# Patient Record
Sex: Female | Born: 1945 | Race: Asian | Hispanic: No | Marital: Married | Smoking: Never smoker
Health system: Southern US, Community
[De-identification: ages and names within clinical notes are randomized; demographics above are authoritative.]

## PROBLEM LIST (undated history)

## (undated) DIAGNOSIS — K2970 Gastritis, unspecified, without bleeding: Secondary | ICD-10-CM

## (undated) DIAGNOSIS — Z9989 Dependence on other enabling machines and devices: Principal | ICD-10-CM

## (undated) DIAGNOSIS — E785 Hyperlipidemia, unspecified: Secondary | ICD-10-CM

## (undated) DIAGNOSIS — G4733 Obstructive sleep apnea (adult) (pediatric): Secondary | ICD-10-CM

## (undated) DIAGNOSIS — E079 Disorder of thyroid, unspecified: Secondary | ICD-10-CM

## (undated) DIAGNOSIS — Z8669 Personal history of other diseases of the nervous system and sense organs: Secondary | ICD-10-CM

## (undated) DIAGNOSIS — R319 Hematuria, unspecified: Secondary | ICD-10-CM

## (undated) DIAGNOSIS — E041 Nontoxic single thyroid nodule: Secondary | ICD-10-CM

## (undated) DIAGNOSIS — H9190 Unspecified hearing loss, unspecified ear: Secondary | ICD-10-CM

## (undated) HISTORY — DX: Disorder of thyroid, unspecified: E07.9

## (undated) HISTORY — PX: ESOPHAGOGASTRODUODENOSCOPY: SHX1529

## (undated) HISTORY — DX: Nontoxic single thyroid nodule: E04.1

## (undated) HISTORY — DX: Dependence on other enabling machines and devices: Z99.89

## (undated) HISTORY — DX: Obstructive sleep apnea (adult) (pediatric): G47.33

## (undated) HISTORY — DX: Unspecified hearing loss, unspecified ear: H91.90

## (undated) HISTORY — DX: Hematuria, unspecified: R31.9

## (undated) HISTORY — DX: Gastritis, unspecified, without bleeding: K29.70

## (undated) HISTORY — DX: Hyperlipidemia, unspecified: E78.5

## (undated) HISTORY — DX: Personal history of other diseases of the nervous system and sense organs: Z86.69

---

## 2014-10-26 DIAGNOSIS — K219 Gastro-esophageal reflux disease without esophagitis: Secondary | ICD-10-CM | POA: Diagnosis not present

## 2014-10-26 DIAGNOSIS — E785 Hyperlipidemia, unspecified: Secondary | ICD-10-CM | POA: Diagnosis not present

## 2014-10-26 DIAGNOSIS — L219 Seborrheic dermatitis, unspecified: Secondary | ICD-10-CM | POA: Diagnosis not present

## 2014-10-26 DIAGNOSIS — R002 Palpitations: Secondary | ICD-10-CM | POA: Diagnosis not present

## 2014-10-28 DIAGNOSIS — E042 Nontoxic multinodular goiter: Secondary | ICD-10-CM | POA: Diagnosis not present

## 2014-10-28 DIAGNOSIS — R312 Other microscopic hematuria: Secondary | ICD-10-CM | POA: Diagnosis not present

## 2014-10-28 DIAGNOSIS — R3912 Poor urinary stream: Secondary | ICD-10-CM | POA: Diagnosis not present

## 2014-10-28 DIAGNOSIS — H905 Unspecified sensorineural hearing loss: Secondary | ICD-10-CM | POA: Diagnosis not present

## 2014-10-28 DIAGNOSIS — N952 Postmenopausal atrophic vaginitis: Secondary | ICD-10-CM | POA: Diagnosis not present

## 2014-11-05 DIAGNOSIS — G4733 Obstructive sleep apnea (adult) (pediatric): Secondary | ICD-10-CM | POA: Diagnosis not present

## 2015-01-04 DIAGNOSIS — Z1231 Encounter for screening mammogram for malignant neoplasm of breast: Secondary | ICD-10-CM | POA: Diagnosis not present

## 2015-01-18 DIAGNOSIS — E041 Nontoxic single thyroid nodule: Secondary | ICD-10-CM | POA: Diagnosis not present

## 2015-01-20 DIAGNOSIS — R221 Localized swelling, mass and lump, neck: Secondary | ICD-10-CM | POA: Diagnosis not present

## 2015-01-25 DIAGNOSIS — H2512 Age-related nuclear cataract, left eye: Secondary | ICD-10-CM | POA: Diagnosis not present

## 2015-01-25 DIAGNOSIS — H02831 Dermatochalasis of right upper eyelid: Secondary | ICD-10-CM | POA: Diagnosis not present

## 2015-01-25 DIAGNOSIS — H50112 Monocular exotropia, left eye: Secondary | ICD-10-CM | POA: Diagnosis not present

## 2015-01-25 DIAGNOSIS — H43393 Other vitreous opacities, bilateral: Secondary | ICD-10-CM | POA: Diagnosis not present

## 2015-01-25 DIAGNOSIS — H02834 Dermatochalasis of left upper eyelid: Secondary | ICD-10-CM | POA: Diagnosis not present

## 2015-01-25 DIAGNOSIS — H1013 Acute atopic conjunctivitis, bilateral: Secondary | ICD-10-CM | POA: Diagnosis not present

## 2015-02-01 DIAGNOSIS — E042 Nontoxic multinodular goiter: Secondary | ICD-10-CM | POA: Diagnosis not present

## 2015-02-10 DIAGNOSIS — E049 Nontoxic goiter, unspecified: Secondary | ICD-10-CM | POA: Diagnosis not present

## 2015-02-10 DIAGNOSIS — E042 Nontoxic multinodular goiter: Secondary | ICD-10-CM | POA: Diagnosis not present

## 2015-02-10 DIAGNOSIS — E041 Nontoxic single thyroid nodule: Secondary | ICD-10-CM | POA: Diagnosis not present

## 2015-02-15 DIAGNOSIS — E042 Nontoxic multinodular goiter: Secondary | ICD-10-CM | POA: Diagnosis not present

## 2015-04-26 DIAGNOSIS — K30 Functional dyspepsia: Secondary | ICD-10-CM | POA: Diagnosis not present

## 2015-04-26 DIAGNOSIS — R194 Change in bowel habit: Secondary | ICD-10-CM | POA: Diagnosis not present

## 2015-04-26 DIAGNOSIS — R1032 Left lower quadrant pain: Secondary | ICD-10-CM | POA: Diagnosis not present

## 2015-04-26 DIAGNOSIS — E785 Hyperlipidemia, unspecified: Secondary | ICD-10-CM | POA: Diagnosis not present

## 2015-05-05 DIAGNOSIS — R194 Change in bowel habit: Secondary | ICD-10-CM | POA: Diagnosis not present

## 2015-05-05 DIAGNOSIS — R1032 Left lower quadrant pain: Secondary | ICD-10-CM | POA: Diagnosis not present

## 2015-05-05 DIAGNOSIS — N281 Cyst of kidney, acquired: Secondary | ICD-10-CM | POA: Diagnosis not present

## 2015-05-17 DIAGNOSIS — K3189 Other diseases of stomach and duodenum: Secondary | ICD-10-CM | POA: Diagnosis not present

## 2015-05-17 DIAGNOSIS — R1032 Left lower quadrant pain: Secondary | ICD-10-CM | POA: Diagnosis not present

## 2015-05-17 DIAGNOSIS — R194 Change in bowel habit: Secondary | ICD-10-CM | POA: Diagnosis not present

## 2015-05-19 DIAGNOSIS — R1032 Left lower quadrant pain: Secondary | ICD-10-CM | POA: Diagnosis not present

## 2015-05-19 DIAGNOSIS — K2950 Unspecified chronic gastritis without bleeding: Secondary | ICD-10-CM | POA: Diagnosis not present

## 2015-05-19 DIAGNOSIS — K641 Second degree hemorrhoids: Secondary | ICD-10-CM | POA: Diagnosis not present

## 2015-05-19 DIAGNOSIS — K3189 Other diseases of stomach and duodenum: Secondary | ICD-10-CM | POA: Diagnosis not present

## 2015-10-14 ENCOUNTER — Ambulatory Visit (INDEPENDENT_AMBULATORY_CARE_PROVIDER_SITE_OTHER): Payer: Medicare Other | Admitting: Neurology

## 2015-10-14 ENCOUNTER — Encounter: Payer: Self-pay | Admitting: Neurology

## 2015-10-14 VITALS — BP 152/80 | HR 76 | Resp 20 | Ht 62.0 in | Wt 121.0 lb

## 2015-10-14 DIAGNOSIS — Z9989 Dependence on other enabling machines and devices: Secondary | ICD-10-CM

## 2015-10-14 DIAGNOSIS — G4733 Obstructive sleep apnea (adult) (pediatric): Secondary | ICD-10-CM | POA: Diagnosis not present

## 2015-10-14 NOTE — Progress Notes (Signed)
SLEEP MEDICINE CLINIC   Provider:  Larey Seat, M D  Referring Provider: Boris Lown* Primary Care Physician:  No primary care provider on file.  Chief Complaint  Patient presents with  . New Patient (Initial Visit)    on cpap, has had sleep study, moved from [place], with interpreter, rm 31, with husband    HPI:  Heather Page is a 70 y.o. female , seen here as a referral/ revisit  from Dr. Landry Mellow for continuation of sleep care.   Mrs. Heather Page has also moved recently from [place] in the company of her husband. She had been evaluated in the year 2009 for sleep apnea. Dr. Verdell Carmine at [facility] diagnosed her after a sleep study on 02/05/2008 with an AHI of 19.9 RDI of 20.2 and REM dependent RDI of 60.6. The patient had clearly obstructive sleep apnea the lowest oxygen level at night was 79% saturation she did not have significant periodic limb movements she had intermittent snoring which was considered moderate she had a regular sinus rhythm. ECG but his sleep was fragmented she had about 13 brief arousals every hour of sleep. She had first done an overnight pulse oximetry which had indicated that she likely has sleep apnea before the study confirmed the diagnosis. The patient was then titrated to CPAP on 02/27/2008 initiated at 4 cm the pressure only had to be advanced to 6 cm water. This eliminated sleep apnea completely to an AHI of 0.0. The patient has continued to use CPAP at only 5 cm water pressure she has not needed any adjustments she has been except exquisitely compliance and her download today shows that except for the days of new year she has used the machine compliantly for 80% of the time was 6 hours and 26 minutes of average daily use, a residual AHI of 0 point a was confirmed.  She is using nasal pillows. [name] needs only supplies , no adjustment.   Sleep habits are as follows:  The patient usually goes to bed  around 11 PM maybe a little earlier than her husband, [name]. She usually falls asleep promptly, will sleeps through for the first 4 hours of the night and then wakes up at least once. Most nights she will sleep through after that again. She does not report a significant sleep fragmentation. He feels usually refreshed and restored in the morning when she wakes up. She wakes up spontaneously at about the time her husband wakes up, 6.30 AM.  She exercises in the morning. She drinks no tea, no caffee and only water or juice.   She does not nap during the day.  Sleep medical history and family sleep history: She reports that some of her sisters have insomnia with difficulties to initiate sleep but she is not aware of family members been diagnosed with apnea.  Social history: married, chinese born.   Review of Systems: Out of a complete 14 system review, the patient complains of only the following symptoms, and all other reviewed systems are negative.   Epworth score  3 , Fatigue severity score 12  , depression score n/a    Social History   Social History  . Marital Status: Married    Spouse Name: N/A  . Number of Children: N/A  . Years of Education: N/A   Occupational History  . Not on file.   Social History Main Topics  . Smoking status: Never Smoker   . Smokeless  tobacco: Not on file  . Alcohol Use: No  . Drug Use: No  . Sexual Activity: Not on file   Other Topics Concern  . Not on file   Social History Narrative   Denies caffeine use.    Family History  Problem Relation Age of Onset  . Diabetes Mother     Past Medical History  Diagnosis Date  . Thyroid disease   . OSA (obstructive sleep apnea)     History reviewed. No pertinent past surgical history.  Current Outpatient Prescriptions  Medication Sig Dispense Refill  . Calcium Carb-Cholecalciferol (CALCIUM 600 + D PO) Take by mouth.    Marland Kitchen glucosamine-chondroitin 500-400 MG tablet Take 1 tablet by mouth 3 (three)  times daily.    . Omega-3 Fatty Acids (FISH OIL PO) Take by mouth.    Marland Kitchen omeprazole (PRILOSEC) 20 MG capsule Take 20 mg by mouth daily.     No current facility-administered medications for this visit.    Allergies as of 10/14/2015  . (No Known Allergies)    Vitals: BP 152/80 mmHg  Pulse 76  Resp 20  Ht 5\' 2"  (1.575 m)  Wt 121 lb (54.885 kg)  BMI 22.13 kg/m2 Last Weight:  Wt Readings from Last 1 Encounters:  10/14/15 121 lb (54.885 kg)   [id number] mass index is 22.13 kg/(m^2).     Last Height:   Ht Readings from Last 1 Encounters:  10/14/15 5\' 2"  (1.575 m)    Physical exam:  General: The patient is awake, alert and appears not in acute distress. The patient is well groomed. Head: Normocephalic, atraumatic. Neck is supple. Mallampati 1,  neck circumference: 13.75. Nasal airflow unrestricted  TMJ is not evident . Retrognathia is seen. She has thyroid nodules.  Cardiovascular:  Regular rate and rhythm , without  murmurs or carotid bruit, and without distended neck veins. Respiratory: Lungs are clear to auscultation. Skin:  Without evidence of edema, or rash Trunk:erect  Neurologic exam : The patient is awake and alert, oriented to place and time.   Memory subjective  described as intact.  Attention span & concentration ability appears normal.  Speech is fluent,  without  dysarthria, dysphonia or aphasia.  Mood and affect are appropriate.  Cranial nerves: Pupils are equal and briskly reactive to light. Funduscopic exam without  evidence of pallor or edema.  Extraocular movements  in vertical and horizontal planes intact and without nystagmus. Visual fields by finger perimetry are intact. Hearing to finger rub intact.   Facial sensation intact to fine touch.  Facial motor strength is symmetric and tongue and uvula move midline. Shoulder shrug was symmetrical.   Motor exam:   Normal tone, muscle bulk and symmetric strength in all extremities. She reports elbow pain.    Gait  and station: Patient walks without assistive device and is able unassisted to climb up to the exam table. Strength within normal limits.  Stance is stable and normal..Tandem gait is unfragmented. Turns with  3  Steps.  Deep tendon reflexes: in the  upper and lower extremities are symmetric and intact. Babinski maneuver response is downgoing.  The patient was advised of the nature of the diagnosed sleep disorder , the treatment options and risks for general a health and wellness arising from not treating the condition.  I spent more than 45  minutes of face to face time with the patient. We used a Optometrist.  Greater than 50% of time was spent in counseling and coordination of care. We  have discussed the diagnosis and differential and I answered the patient's questions.     Assessment:  After physical and neurologic examination, review of laboratory studies,  Personal review of imaging studies, reports of other /same  Imaging studies.  Results of polysomnography/ neurophysiology testing and pre-existing records as far as provided in visit., my assessment is   1)  OSA, well controlled on a minimal CPAP pressure of only 5 cm water with nasal pillows, supplies needed. Compliance is excellent.   2) the patient needs to establish himself with a local primary care physician, I would prefer an internist for her given that she also has to have follow-up on her thyroid condition, and eats yearly general follow-ups.  Plan:  Treatment plan and additional workup :  Supplies through [facility] or [facility], same as husbands.  RV in 6 month, yearly after that.      Asencion Partridge Gail Creekmore MD  10/14/2015   CC: Boris Lown, [address], [place] [id number]

## 2015-10-14 NOTE — Patient Instructions (Signed)

## 2015-11-10 DIAGNOSIS — H5010 Unspecified exotropia: Secondary | ICD-10-CM | POA: Diagnosis not present

## 2015-11-10 DIAGNOSIS — H25012 Cortical age-related cataract, left eye: Secondary | ICD-10-CM | POA: Diagnosis not present

## 2015-11-10 DIAGNOSIS — H43811 Vitreous degeneration, right eye: Secondary | ICD-10-CM | POA: Diagnosis not present

## 2015-11-10 DIAGNOSIS — Z0100 Encounter for examination of eyes and vision without abnormal findings: Secondary | ICD-10-CM | POA: Diagnosis not present

## 2015-11-11 ENCOUNTER — Telehealth: Payer: Self-pay

## 2015-11-11 NOTE — Telephone Encounter (Signed)
Was notified by Venida Jarvis of [facility] that pt cancelled his appt with them for new patient because it was "too far to drive."

## 2015-11-12 DIAGNOSIS — J069 Acute upper respiratory infection, unspecified: Secondary | ICD-10-CM | POA: Diagnosis not present

## 2015-11-15 DIAGNOSIS — J209 Acute bronchitis, unspecified: Secondary | ICD-10-CM | POA: Diagnosis not present

## 2015-11-15 DIAGNOSIS — R509 Fever, unspecified: Secondary | ICD-10-CM | POA: Diagnosis not present

## 2015-11-15 DIAGNOSIS — R05 Cough: Secondary | ICD-10-CM | POA: Diagnosis not present

## 2016-01-03 DIAGNOSIS — K2970 Gastritis, unspecified, without bleeding: Secondary | ICD-10-CM | POA: Diagnosis not present

## 2016-01-03 DIAGNOSIS — H9192 Unspecified hearing loss, left ear: Secondary | ICD-10-CM | POA: Diagnosis not present

## 2016-01-03 DIAGNOSIS — E785 Hyperlipidemia, unspecified: Secondary | ICD-10-CM | POA: Diagnosis not present

## 2016-01-03 DIAGNOSIS — Z8669 Personal history of other diseases of the nervous system and sense organs: Secondary | ICD-10-CM | POA: Diagnosis not present

## 2016-01-03 DIAGNOSIS — R3129 Other microscopic hematuria: Secondary | ICD-10-CM | POA: Diagnosis not present

## 2016-01-03 DIAGNOSIS — L219 Seborrheic dermatitis, unspecified: Secondary | ICD-10-CM | POA: Diagnosis not present

## 2016-01-03 DIAGNOSIS — G4730 Sleep apnea, unspecified: Secondary | ICD-10-CM | POA: Diagnosis not present

## 2016-01-03 DIAGNOSIS — R7309 Other abnormal glucose: Secondary | ICD-10-CM | POA: Diagnosis not present

## 2016-01-03 DIAGNOSIS — Z1239 Encounter for other screening for malignant neoplasm of breast: Secondary | ICD-10-CM | POA: Diagnosis not present

## 2016-01-03 DIAGNOSIS — E041 Nontoxic single thyroid nodule: Secondary | ICD-10-CM | POA: Diagnosis not present

## 2016-01-03 DIAGNOSIS — H1132 Conjunctival hemorrhage, left eye: Secondary | ICD-10-CM | POA: Diagnosis not present

## 2016-01-04 ENCOUNTER — Other Ambulatory Visit: Payer: Self-pay

## 2016-01-04 DIAGNOSIS — E041 Nontoxic single thyroid nodule: Secondary | ICD-10-CM | POA: Diagnosis not present

## 2016-01-04 DIAGNOSIS — Z1231 Encounter for screening mammogram for malignant neoplasm of breast: Secondary | ICD-10-CM

## 2016-01-04 DIAGNOSIS — R7309 Other abnormal glucose: Secondary | ICD-10-CM | POA: Diagnosis not present

## 2016-01-04 DIAGNOSIS — E785 Hyperlipidemia, unspecified: Secondary | ICD-10-CM | POA: Diagnosis not present

## 2016-01-12 DIAGNOSIS — H903 Sensorineural hearing loss, bilateral: Secondary | ICD-10-CM | POA: Diagnosis not present

## 2016-01-12 DIAGNOSIS — E041 Nontoxic single thyroid nodule: Secondary | ICD-10-CM | POA: Diagnosis not present

## 2016-01-12 DIAGNOSIS — H9312 Tinnitus, left ear: Secondary | ICD-10-CM | POA: Diagnosis not present

## 2016-01-24 ENCOUNTER — Ambulatory Visit: Payer: Self-pay

## 2016-03-13 ENCOUNTER — Ambulatory Visit: Payer: Self-pay

## 2016-03-13 DIAGNOSIS — H25012 Cortical age-related cataract, left eye: Secondary | ICD-10-CM | POA: Diagnosis not present

## 2016-03-16 ENCOUNTER — Ambulatory Visit
Admission: RE | Admit: 2016-03-16 | Discharge: 2016-03-16 | Disposition: A | Discharge status: Home / Self Care | Payer: Medicare Other | Source: Ambulatory Visit

## 2016-03-16 DIAGNOSIS — Z1231 Encounter for screening mammogram for malignant neoplasm of breast: Secondary | ICD-10-CM

## 2016-04-13 ENCOUNTER — Encounter: Payer: Self-pay | Admitting: Neurology

## 2016-04-13 ENCOUNTER — Ambulatory Visit (INDEPENDENT_AMBULATORY_CARE_PROVIDER_SITE_OTHER): Payer: Medicare Other | Admitting: Neurology

## 2016-04-13 VITALS — BP 136/80 | HR 86 | Resp 20 | Ht 62.0 in | Wt 112.0 lb

## 2016-04-13 DIAGNOSIS — G4733 Obstructive sleep apnea (adult) (pediatric): Secondary | ICD-10-CM | POA: Diagnosis not present

## 2016-04-13 DIAGNOSIS — Z9989 Dependence on other enabling machines and devices: Secondary | ICD-10-CM | POA: Insufficient documentation

## 2016-04-13 HISTORY — DX: Dependence on other enabling machines and devices: Z99.89

## 2016-04-13 HISTORY — DX: Obstructive sleep apnea (adult) (pediatric): G47.33

## 2016-04-13 NOTE — Progress Notes (Signed)
SLEEP MEDICINE CLINIC   Provider:  Larey Seat, M D  Referring Provider: No ref. provider found Primary Care Physician:  Gara Kroner, MD  Chief Complaint  Patient presents with  . Follow-up    cpap going well    HPI:  Heather Page is a 70 y.o. female , seen here as a referral  from Dr. Norton Blizzard  for continuation of sleep care.   Heather Page  has also moved recently from [place] with her husband. She had been evaluated in the year 2009 for sleep apnea. [facility] physician  [facility] her after a sleep study on 02/05/2008 with an AHI of 19.9, an RDI of 20.2 and REM dependent RDI of 60.6.  The patient had clearly obstructive sleep apnea the lowest oxygen level at night was 79% saturation she did not have significant periodic limb movements she had intermittent snoring which was considered moderate she had a regular sinus rhythm. ECG but his sleep was fragmented she had about 13 brief arousals every hour of sleep. She had first done an overnight pulse oximetry which had indicated that she likely has sleep apnea before the study confirmed the diagnosis. The patient was then titrated to CPAP on 02/27/2008 initiated at 4 cm the pressure only had to be advanced to 6 cm water. This eliminated sleep apnea completely to an AHI of 0.0. The patient has continued to use CPAP at only 5 cm water pressure she has not needed any adjustments she has been except exquisitely compliance and her download today shows that except for the days of new year she has used the machine compliantly for 80% of the time was 6 hours and 26 minutes of average daily use, a residual AHI of 0 point a was confirmed. She is using nasal pillows. Heather Page needs only supplies , no adjustment.   Sleep habits are as follows:  The patient usually goes to bed around 11 PM maybe a little earlier than her husband, Mr Heather Page. She usually falls asleep promptly, will sleeps through for the first 4 hours of the  night and then wakes up at least once. Most nights she will sleep through after that again. She does not report a significant sleep fragmentation. He feels usually refreshed and restored in the morning when she wakes up. She wakes up spontaneously at about the time her husband wakes up, 6.30 AM.  She exercises in the morning. She drinks no tea, no caffee and only water or juice.   She does not nap during the day.  Sleep medical history and family sleep history: She reports that some of her sisters have insomnia with difficulties to initiate sleep but she is not aware of family members been diagnosed with apnea. Social history: married, chinese born.   Interval history from [id number],  Heather Page here today for her regular compliance visit regarding CPAP use. Her compliance is 83% with an average of 6 hours and 3 minutes of daily CPAP use. She is using 5 cm CPAP with out EPR and achieved a residual AHI of only 0.8. Needs to be no adjustments made. Home town oxygen/ now [employer]. DME.   Review of Systems: Out of a complete 14 system review, the patient complains of only the following symptoms, and all other reviewed systems are negative.  Epworth score  3 , Fatigue severity score 12  , depression score n/a    Social History   Social History  . Marital Status:  Married    Spouse Name: N/A  . Number of Children: N/A  . Years of Education: N/A   Occupational History  . Not on file.   Social History Main Topics  . Smoking status: Never Smoker   . Smokeless tobacco: Not on file  . Alcohol Use: No  . Drug Use: No  . Sexual Activity: Not on file   Other Topics Concern  . Not on file   Social History Narrative   Denies caffeine use.    Family History  Problem Relation Age of Onset  . Diabetes Mother     Past Medical History  Diagnosis Date  . Thyroid disease   . OSA (obstructive sleep apnea)   . OSA on CPAP 04/13/2016    No past surgical history on file.  Current  Outpatient Prescriptions  Medication Sig Dispense Refill  . Calcium Carb-Cholecalciferol (CALCIUM 600 + D PO) Take by mouth.    Marland Kitchen glucosamine-chondroitin 500-400 MG tablet Take 1 tablet by mouth 3 (three) times daily.    . Omega-3 Fatty Acids (FISH OIL PO) Take by mouth.    Marland Kitchen omeprazole (PRILOSEC) 20 MG capsule Take 20 mg by mouth daily.     No current facility-administered medications for this visit.    Allergies as of 04/13/2016  . (No Known Allergies)    Vitals: BP 136/80 mmHg  Pulse 86  Resp 20  Ht 5\' 2"  (1.575 m)  Wt 112 lb (50.803 kg)  BMI 20.48 kg/m2 Last Weight:  Wt Readings from Last 1 Encounters:  04/13/16 112 lb (50.803 kg)   [id number] mass index is 20.48 kg/(m^2).     Last Height:   Ht Readings from Last 1 Encounters:  04/13/16 5\' 2"  (1.575 m)    Physical exam:  General: The patient is awake, alert and appears not in acute distress. The patient is well groomed. Head: Normocephalic, atraumatic. Neck is supple. Mallampati 2,  Tongue midline.   neck circumference: 13.50. Nasal airflow unrestricted  TMJ is not evident . Retrognathia is seen. She has thyroid nodules.  Cardiovascular:  Regular rate and rhythm, without  murmurs or carotid bruit, and without distended neck veins. Respiratory: Lungs are clear to auscultation. Skin:  Without evidence of edema, or rash Trunk:erect  Neurologic exam : The patient is awake and alert, oriented to place and time.    Cranial nerves:  The patient reports normal taste and smell sensation, Pupils are equal and briskly reactive to light.  Hearing to finger rub ; lost hearing on the left.   Facial sensation intact to fine touch.  Facial motor strength is symmetric and tongue and uvula move midline. Shoulder shrug was symmetrical.    The patient was advised of the nature of the diagnosed sleep disorder , the treatment options and risks for general a health and wellness arising from not treating the condition.  I spent more than  15  minutes of face to face time with the patient. We used a Optometrist.  Greater than 50% of time was spent in counseling and coordination of care. We have discussed the diagnosis and differential and I answered the patient's questions.     Assessment:  After physical and neurologic examination, review of laboratory studies,  Personal review of imaging studies, reports of other /same  Imaging studies.  Results of polysomnography/ neurophysiology testing and pre-existing records as far as provided in visit., my assessment is   1)  OSA, well controlled on a minimal CPAP pressure of only  5 cm water with nasal pillows, supplies needed. Compliance is excellent. She will follow once a year .  2) the patient needs to establish himself with a local primary care physician, I would prefer an internist for her given that she also has to have follow-up on her thyroid condition, and eats yearly general follow-ups.  Plan:  Treatment plan and additional workup :  Supplies through [facility] or [facility], same as husbands.  RV in 6 month, yearly after that.  Dr. Antony Contras, [place].   Asencion Partridge Cristino Degroff MD  04/13/2016   CC: Dr. Norton Blizzard

## 2016-08-09 ENCOUNTER — Other Ambulatory Visit: Payer: Self-pay | Admitting: Otolaryngology

## 2016-08-09 DIAGNOSIS — E041 Nontoxic single thyroid nodule: Secondary | ICD-10-CM | POA: Diagnosis not present

## 2016-08-11 DIAGNOSIS — G4730 Sleep apnea, unspecified: Secondary | ICD-10-CM | POA: Diagnosis not present

## 2016-08-11 DIAGNOSIS — L219 Seborrheic dermatitis, unspecified: Secondary | ICD-10-CM | POA: Diagnosis not present

## 2016-08-11 DIAGNOSIS — K2970 Gastritis, unspecified, without bleeding: Secondary | ICD-10-CM | POA: Diagnosis not present

## 2016-08-11 DIAGNOSIS — E7800 Pure hypercholesterolemia, unspecified: Secondary | ICD-10-CM | POA: Diagnosis not present

## 2016-08-11 DIAGNOSIS — R7303 Prediabetes: Secondary | ICD-10-CM | POA: Diagnosis not present

## 2016-08-11 DIAGNOSIS — E041 Nontoxic single thyroid nodule: Secondary | ICD-10-CM | POA: Diagnosis not present

## 2016-09-06 ENCOUNTER — Ambulatory Visit
Admission: RE | Admit: 2016-09-06 | Discharge: 2016-09-06 | Disposition: A | Discharge status: Home / Self Care | Payer: Medicare Other | Source: Ambulatory Visit | Attending: Otolaryngology | Admitting: Otolaryngology

## 2016-09-06 DIAGNOSIS — E041 Nontoxic single thyroid nodule: Secondary | ICD-10-CM | POA: Diagnosis not present

## 2016-09-13 DIAGNOSIS — E041 Nontoxic single thyroid nodule: Secondary | ICD-10-CM | POA: Diagnosis not present

## 2016-12-27 ENCOUNTER — Other Ambulatory Visit: Payer: Self-pay

## 2016-12-27 DIAGNOSIS — G4733 Obstructive sleep apnea (adult) (pediatric): Secondary | ICD-10-CM

## 2016-12-27 DIAGNOSIS — Z9989 Dependence on other enabling machines and devices: Secondary | ICD-10-CM

## 2017-02-21 ENCOUNTER — Other Ambulatory Visit: Payer: Self-pay | Admitting: Family Medicine

## 2017-02-21 DIAGNOSIS — Z1231 Encounter for screening mammogram for malignant neoplasm of breast: Secondary | ICD-10-CM

## 2017-03-12 DIAGNOSIS — H5010 Unspecified exotropia: Secondary | ICD-10-CM | POA: Diagnosis not present

## 2017-03-12 DIAGNOSIS — H524 Presbyopia: Secondary | ICD-10-CM | POA: Diagnosis not present

## 2017-03-12 DIAGNOSIS — H25012 Cortical age-related cataract, left eye: Secondary | ICD-10-CM | POA: Diagnosis not present

## 2017-03-12 DIAGNOSIS — H2512 Age-related nuclear cataract, left eye: Secondary | ICD-10-CM | POA: Diagnosis not present

## 2017-03-20 DIAGNOSIS — K2970 Gastritis, unspecified, without bleeding: Secondary | ICD-10-CM | POA: Diagnosis not present

## 2017-03-20 DIAGNOSIS — E7800 Pure hypercholesterolemia, unspecified: Secondary | ICD-10-CM | POA: Diagnosis not present

## 2017-03-20 DIAGNOSIS — Z1159 Encounter for screening for other viral diseases: Secondary | ICD-10-CM | POA: Diagnosis not present

## 2017-03-20 DIAGNOSIS — Z13820 Encounter for screening for osteoporosis: Secondary | ICD-10-CM | POA: Diagnosis not present

## 2017-03-20 DIAGNOSIS — Z0000 Encounter for general adult medical examination without abnormal findings: Secondary | ICD-10-CM | POA: Diagnosis not present

## 2017-03-20 DIAGNOSIS — Z1211 Encounter for screening for malignant neoplasm of colon: Secondary | ICD-10-CM | POA: Diagnosis not present

## 2017-03-20 DIAGNOSIS — Z23 Encounter for immunization: Secondary | ICD-10-CM | POA: Diagnosis not present

## 2017-03-20 DIAGNOSIS — E041 Nontoxic single thyroid nodule: Secondary | ICD-10-CM | POA: Diagnosis not present

## 2017-03-20 DIAGNOSIS — R7303 Prediabetes: Secondary | ICD-10-CM | POA: Diagnosis not present

## 2017-03-20 DIAGNOSIS — L219 Seborrheic dermatitis, unspecified: Secondary | ICD-10-CM | POA: Diagnosis not present

## 2017-03-20 DIAGNOSIS — G4730 Sleep apnea, unspecified: Secondary | ICD-10-CM | POA: Diagnosis not present

## 2017-03-20 DIAGNOSIS — R252 Cramp and spasm: Secondary | ICD-10-CM | POA: Diagnosis not present

## 2017-03-21 ENCOUNTER — Ambulatory Visit
Admission: RE | Admit: 2017-03-21 | Discharge: 2017-03-21 | Disposition: A | Discharge status: Home / Self Care | Payer: Medicare Other | Source: Ambulatory Visit | Attending: Family Medicine | Admitting: Family Medicine

## 2017-03-21 ENCOUNTER — Encounter: Payer: Self-pay | Admitting: Radiology

## 2017-03-21 DIAGNOSIS — Z1231 Encounter for screening mammogram for malignant neoplasm of breast: Secondary | ICD-10-CM | POA: Diagnosis not present

## 2017-03-30 DIAGNOSIS — M810 Age-related osteoporosis without current pathological fracture: Secondary | ICD-10-CM | POA: Diagnosis not present

## 2017-03-30 DIAGNOSIS — Z13820 Encounter for screening for osteoporosis: Secondary | ICD-10-CM | POA: Diagnosis not present

## 2017-04-16 ENCOUNTER — Ambulatory Visit (INDEPENDENT_AMBULATORY_CARE_PROVIDER_SITE_OTHER): Payer: Medicare Other | Admitting: Neurology

## 2017-04-16 ENCOUNTER — Encounter: Payer: Self-pay | Admitting: Neurology

## 2017-04-16 VITALS — BP 135/71 | HR 75 | Ht 60.0 in | Wt 120.0 lb

## 2017-04-16 DIAGNOSIS — Z9989 Dependence on other enabling machines and devices: Secondary | ICD-10-CM | POA: Diagnosis not present

## 2017-04-16 DIAGNOSIS — G4733 Obstructive sleep apnea (adult) (pediatric): Secondary | ICD-10-CM | POA: Diagnosis not present

## 2017-04-16 NOTE — Progress Notes (Signed)
SLEEP MEDICINE CLINIC   Provider:  Larey Page, M D  Referring Provider: Antony Contras, MD Primary Care Physician:  Heather Contras, MD  Chief Complaint  Patient presents with  . Follow-up    HPI:  Heather Page is a 71 y.o. female , seen here as a referral  from Dr. Norton Page  for continuation of sleep care.   Heather Page  has also moved recently from [place] with her husband. She had been evaluated in the year 2009 for sleep apnea. [facility], and  was diagnosed  after a sleep study on 02/05/2008 with an AHI of 19.9, an RDI of 20.2 and REM dependent RDI of 60.6.  The patient had clearly obstructive sleep apnea the lowest oxygen level at night was 79% saturation she did not have significant periodic limb movements she had intermittent snoring which was considered moderate she had a regular sinus rhythm. ECG but his sleep was fragmented she had about 13 brief arousals every hour of sleep. She had first done an overnight pulse oximetry which had indicated that she likely has sleep apnea before the study confirmed the diagnosis. The patient was then titrated to CPAP on 02/27/2008 initiated at 4 cm the pressure only had to be advanced to 6 cm water. This eliminated sleep apnea completely to an AHI of 0.0. The patient has continued to use CPAP at only 5 cm water pressure she has not needed any adjustments she has been except exquisitely compliance and her download today shows that except for the days of new year she has used the machine compliantly for 80% of the time was 6 hours and 26 minutes of average daily use, a residual AHI of 0 point a was confirmed. She is using nasal pillows. Heather Page needs only supplies , no adjustment.   Sleep habits are as follows: The patient usually goes to bed around 11 PM maybe a little earlier than her husband, Mr Heather Page. She usually falls asleep promptly, will sleeps through for the first 4 hours of the night and then wakes up at least once.  Most nights she will sleep through after that again. She does not report a significant sleep fragmentation. He feels usually refreshed and restored in the morning when she wakes up. She wakes up spontaneously at about the time her husband wakes up, 6.30 AM.  She exercises in the morning. She drinks no tea, no caffee and only water or juice.   She does not nap during the day.  Sleep medical history and family sleep history: She reports that some of her sisters have insomnia with difficulties to initiate sleep but she is not aware of family members been diagnosed with apnea. Social history: married, chinese born.   Interval history from 42/59/5638, [name] Heather Page is  here today for her regular compliance visit regarding CPAP use. Her compliance is 83% with an average of 6 hours and 3 minutes of daily CPAP use. She is using 5 cm CPAP with out EPR and achieved a residual AHI of only 0.8. Needs to be no adjustments made. Home town oxygen/ now [employer]. DME.   Interval history from 04/16/2017, I have the pleasure of seeing Heather Page) today for a yearly revisit. The patient has been in excellent compliance with CPAP use for 100% an average user time of 7 hours and 32 minutes at night. This CPAP is still set at only 5 cm water without expiratory pressure relief, her  AHI is reduced to 0.8 per hour and she has mild air leaks. Based on these results, no adjustment has to be done. The patient can follow-up in one year for a regular compliance visit. I will be happy to see her as any other problems arise in the meantime. She continues to use nasal pillows.  Review of Systems: Out of a complete 14 system review, the patient complains of only the following symptoms, and all other reviewed systems are negative.  Epworth score 1 , Fatigue severity score 13 , depression score n/a    Social History   Social History  . Marital status: Married    Spouse name: N/A  . Number of children: N/A  . Years of  education: N/A   Occupational History  . Not on file.   Social History Main Topics  . Smoking status: Never Smoker  . Smokeless tobacco: Never Used  . Alcohol use No  . Drug use: No  . Sexual activity: Not on file   Other Topics Concern  . Not on file   Social History Narrative   Denies caffeine use.    Family History  Problem Relation Age of Onset  . Diabetes Mother     Past Medical History:  Diagnosis Date  . OSA (obstructive sleep apnea)   . OSA on CPAP 04/13/2016  . Thyroid disease     No past surgical history on file.  Current Outpatient Prescriptions  Medication Sig Dispense Refill  . Calcium Carb-Cholecalciferol (CALCIUM 600 + D PO) Take by mouth.    Marland Kitchen glucosamine-chondroitin 500-400 MG tablet Take 1 tablet by mouth 3 (three) times daily.    . Omega-3 Fatty Acids (FISH OIL PO) Take by mouth.    Marland Kitchen omeprazole (PRILOSEC) 20 MG capsule Take 20 mg by mouth daily.     No current facility-administered medications for this visit.     Allergies as of 04/16/2017  . (No Known Allergies)    Vitals: BP 135/71   Pulse 75   Ht 5' (1.524 m)   Wt 120 lb (54.4 kg)   BMI 23.44 kg/m  Last Weight:  Wt Readings from Last 1 Encounters:  04/16/17 120 lb (54.4 kg)   WNI:OEVO mass index is 23.44 kg/m.     Last Height:   Ht Readings from Last 1 Encounters:  04/16/17 5' (1.524 m)    Physical exam:  General: The patient is awake, alert and appears not in acute distress. The patient is well groomed. Head: Normocephalic, atraumatic. Neck is supple. Mallampati 2,  Tongue midline.   neck circumference: 13.50. Nasal airflow unrestricted  moderate retrognathia is seen. She has thyroid nodules.  Cardiovascular:  Regular rate and rhythm. Respiratory: Lungs are clear to auscultation. Skin:  Without evidence of edema, or rash Trunk: erect  Neurologic exam :The patient is awake and alert, oriented to place and time.     The patient was advised of the nature of the diagnosed  sleep disorder , the treatment options and risks for general a health and wellness arising from not treating the condition.  I spent more than 15  minutes of face to face time with the patient. We used a Optometrist.  Greater than 50% of time was spent in counseling and coordination of care. We have discussed the diagnosis and differential and I answered the patient's questions.     Assessment:  After physical and neurologic examination, review of laboratory studies,  Personal review of imaging studies, reports of other /same  Imaging studies.  Results of polysomnography/ neurophysiology testing and pre-existing records as far as provided in visit., my assessment is   1)  OSA, well controlled on a minimal CPAP pressure of only 5 cm water with nasal pillows, supplies needed. Compliance is excellent at 100 %. She will follow once a year . AHi is 0.8 .      Heather Partridge Psalms Olarte MD  04/16/2017   CC: Dr Heather Page,  [place].

## 2017-04-20 DIAGNOSIS — M810 Age-related osteoporosis without current pathological fracture: Secondary | ICD-10-CM | POA: Diagnosis not present

## 2017-04-20 DIAGNOSIS — M542 Cervicalgia: Secondary | ICD-10-CM | POA: Diagnosis not present

## 2017-04-25 DIAGNOSIS — K3189 Other diseases of stomach and duodenum: Secondary | ICD-10-CM | POA: Diagnosis not present

## 2017-04-25 DIAGNOSIS — K2900 Acute gastritis without bleeding: Secondary | ICD-10-CM | POA: Diagnosis not present

## 2017-05-02 DIAGNOSIS — K2930 Chronic superficial gastritis without bleeding: Secondary | ICD-10-CM | POA: Diagnosis not present

## 2017-05-02 DIAGNOSIS — B9681 Helicobacter pylori [H. pylori] as the cause of diseases classified elsewhere: Secondary | ICD-10-CM | POA: Diagnosis not present

## 2017-05-02 DIAGNOSIS — K3189 Other diseases of stomach and duodenum: Secondary | ICD-10-CM | POA: Diagnosis not present

## 2017-05-08 DIAGNOSIS — K2930 Chronic superficial gastritis without bleeding: Secondary | ICD-10-CM | POA: Diagnosis not present

## 2017-06-12 DIAGNOSIS — Z800 Family history of malignant neoplasm of digestive organs: Secondary | ICD-10-CM | POA: Diagnosis not present

## 2017-06-12 DIAGNOSIS — K3189 Other diseases of stomach and duodenum: Secondary | ICD-10-CM | POA: Diagnosis not present

## 2017-09-13 DIAGNOSIS — H9042 Sensorineural hearing loss, unilateral, left ear, with unrestricted hearing on the contralateral side: Secondary | ICD-10-CM | POA: Diagnosis not present

## 2017-09-13 DIAGNOSIS — E041 Nontoxic single thyroid nodule: Secondary | ICD-10-CM | POA: Diagnosis not present

## 2017-09-14 ENCOUNTER — Other Ambulatory Visit: Payer: Self-pay | Admitting: Otolaryngology

## 2017-09-14 DIAGNOSIS — E041 Nontoxic single thyroid nodule: Secondary | ICD-10-CM

## 2017-09-17 ENCOUNTER — Ambulatory Visit
Admission: RE | Admit: 2017-09-17 | Discharge: 2017-09-17 | Disposition: A | Discharge status: Home / Self Care | Payer: Medicare Other | Source: Ambulatory Visit | Attending: Otolaryngology | Admitting: Otolaryngology

## 2017-09-17 DIAGNOSIS — E042 Nontoxic multinodular goiter: Secondary | ICD-10-CM | POA: Diagnosis not present

## 2017-09-17 DIAGNOSIS — E7800 Pure hypercholesterolemia, unspecified: Secondary | ICD-10-CM | POA: Diagnosis not present

## 2017-09-17 DIAGNOSIS — G4730 Sleep apnea, unspecified: Secondary | ICD-10-CM | POA: Diagnosis not present

## 2017-09-17 DIAGNOSIS — K2970 Gastritis, unspecified, without bleeding: Secondary | ICD-10-CM | POA: Diagnosis not present

## 2017-09-17 DIAGNOSIS — R7303 Prediabetes: Secondary | ICD-10-CM | POA: Diagnosis not present

## 2017-09-17 DIAGNOSIS — M810 Age-related osteoporosis without current pathological fracture: Secondary | ICD-10-CM | POA: Diagnosis not present

## 2017-09-17 DIAGNOSIS — E041 Nontoxic single thyroid nodule: Secondary | ICD-10-CM

## 2017-10-17 DIAGNOSIS — K5900 Constipation, unspecified: Secondary | ICD-10-CM | POA: Diagnosis not present

## 2017-10-17 DIAGNOSIS — K3189 Other diseases of stomach and duodenum: Secondary | ICD-10-CM | POA: Diagnosis not present

## 2017-10-17 DIAGNOSIS — Z800 Family history of malignant neoplasm of digestive organs: Secondary | ICD-10-CM | POA: Diagnosis not present

## 2017-10-17 DIAGNOSIS — R1011 Right upper quadrant pain: Secondary | ICD-10-CM | POA: Diagnosis not present

## 2017-10-18 ENCOUNTER — Other Ambulatory Visit: Payer: Self-pay | Admitting: Gastroenterology

## 2017-10-18 DIAGNOSIS — R1011 Right upper quadrant pain: Secondary | ICD-10-CM

## 2017-10-23 ENCOUNTER — Ambulatory Visit
Admission: RE | Admit: 2017-10-23 | Discharge: 2017-10-23 | Disposition: A | Discharge status: Home / Self Care | Payer: Medicare Other | Source: Ambulatory Visit | Attending: Gastroenterology | Admitting: Gastroenterology

## 2017-10-23 DIAGNOSIS — R1011 Right upper quadrant pain: Secondary | ICD-10-CM

## 2017-10-25 DIAGNOSIS — E041 Nontoxic single thyroid nodule: Secondary | ICD-10-CM | POA: Diagnosis not present

## 2017-10-26 DIAGNOSIS — G5692 Unspecified mononeuropathy of left upper limb: Secondary | ICD-10-CM | POA: Diagnosis not present

## 2017-10-26 DIAGNOSIS — I6522 Occlusion and stenosis of left carotid artery: Secondary | ICD-10-CM | POA: Diagnosis not present

## 2017-10-26 DIAGNOSIS — K5900 Constipation, unspecified: Secondary | ICD-10-CM | POA: Diagnosis not present

## 2017-10-26 DIAGNOSIS — E041 Nontoxic single thyroid nodule: Secondary | ICD-10-CM | POA: Diagnosis not present

## 2017-10-30 ENCOUNTER — Other Ambulatory Visit: Payer: Self-pay | Admitting: Family Medicine

## 2017-10-30 DIAGNOSIS — I6522 Occlusion and stenosis of left carotid artery: Secondary | ICD-10-CM

## 2017-11-02 ENCOUNTER — Other Ambulatory Visit (HOSPITAL_COMMUNITY): Payer: Self-pay | Admitting: Otolaryngology

## 2017-11-02 ENCOUNTER — Ambulatory Visit
Admission: RE | Admit: 2017-11-02 | Discharge: 2017-11-02 | Disposition: A | Discharge status: Home / Self Care | Payer: Medicare Other | Source: Ambulatory Visit | Attending: Family Medicine | Admitting: Family Medicine

## 2017-11-02 DIAGNOSIS — H8143 Vertigo of central origin, bilateral: Secondary | ICD-10-CM

## 2017-11-02 DIAGNOSIS — I6522 Occlusion and stenosis of left carotid artery: Secondary | ICD-10-CM

## 2017-11-02 DIAGNOSIS — H90A21 Sensorineural hearing loss, unilateral, right ear, with restricted hearing on the contralateral side: Secondary | ICD-10-CM | POA: Diagnosis not present

## 2017-11-02 DIAGNOSIS — H903 Sensorineural hearing loss, bilateral: Secondary | ICD-10-CM | POA: Diagnosis not present

## 2017-11-02 DIAGNOSIS — I6523 Occlusion and stenosis of bilateral carotid arteries: Secondary | ICD-10-CM | POA: Diagnosis not present

## 2017-11-02 DIAGNOSIS — H90A32 Mixed conductive and sensorineural hearing loss, unilateral, left ear with restricted hearing on the contralateral side: Secondary | ICD-10-CM | POA: Diagnosis not present

## 2017-11-02 DIAGNOSIS — H8102 Meniere's disease, left ear: Secondary | ICD-10-CM | POA: Diagnosis not present

## 2017-11-09 ENCOUNTER — Ambulatory Visit (HOSPITAL_COMMUNITY)
Admission: RE | Admit: 2017-11-09 | Discharge: 2017-11-09 | Disposition: A | Discharge status: Home / Self Care | Payer: Medicare Other | Source: Ambulatory Visit | Attending: Otolaryngology | Admitting: Otolaryngology

## 2017-12-18 ENCOUNTER — Ambulatory Visit (HOSPITAL_COMMUNITY)
Admission: RE | Admit: 2017-12-18 | Discharge: 2017-12-18 | Disposition: A | Discharge status: Home / Self Care | Payer: Medicare Other | Source: Ambulatory Visit | Attending: Otolaryngology | Admitting: Otolaryngology

## 2017-12-18 DIAGNOSIS — R42 Dizziness and giddiness: Secondary | ICD-10-CM | POA: Diagnosis not present

## 2017-12-18 DIAGNOSIS — H8143 Vertigo of central origin, bilateral: Secondary | ICD-10-CM | POA: Diagnosis not present

## 2017-12-18 DIAGNOSIS — H9312 Tinnitus, left ear: Secondary | ICD-10-CM | POA: Diagnosis not present

## 2017-12-18 LAB — POCT I-STAT CREATININE: Creatinine, Ser: 0.7 mg/dL (ref 0.44–1.00)

## 2017-12-18 MED ORDER — GADOBENATE DIMEGLUMINE 529 MG/ML IV SOLN
15.0000 mL | Freq: Once | INTRAVENOUS | Status: AC | PRN
  Administered 2017-12-18: 11 mL via INTRAVENOUS

## 2018-03-26 ENCOUNTER — Other Ambulatory Visit: Payer: Self-pay | Admitting: Family Medicine

## 2018-03-26 DIAGNOSIS — Z1231 Encounter for screening mammogram for malignant neoplasm of breast: Secondary | ICD-10-CM

## 2018-04-09 IMAGING — MG DIGITAL SCREENING BILATERAL MAMMOGRAM WITH CAD
4 series · 4 of 4 positions shown · non-contrast
Comparison: Previous exam(s).

CLINICAL DATA: Screening.

EXAM:
DIGITAL SCREENING BILATERAL MAMMOGRAM WITH CAD

[R CC]
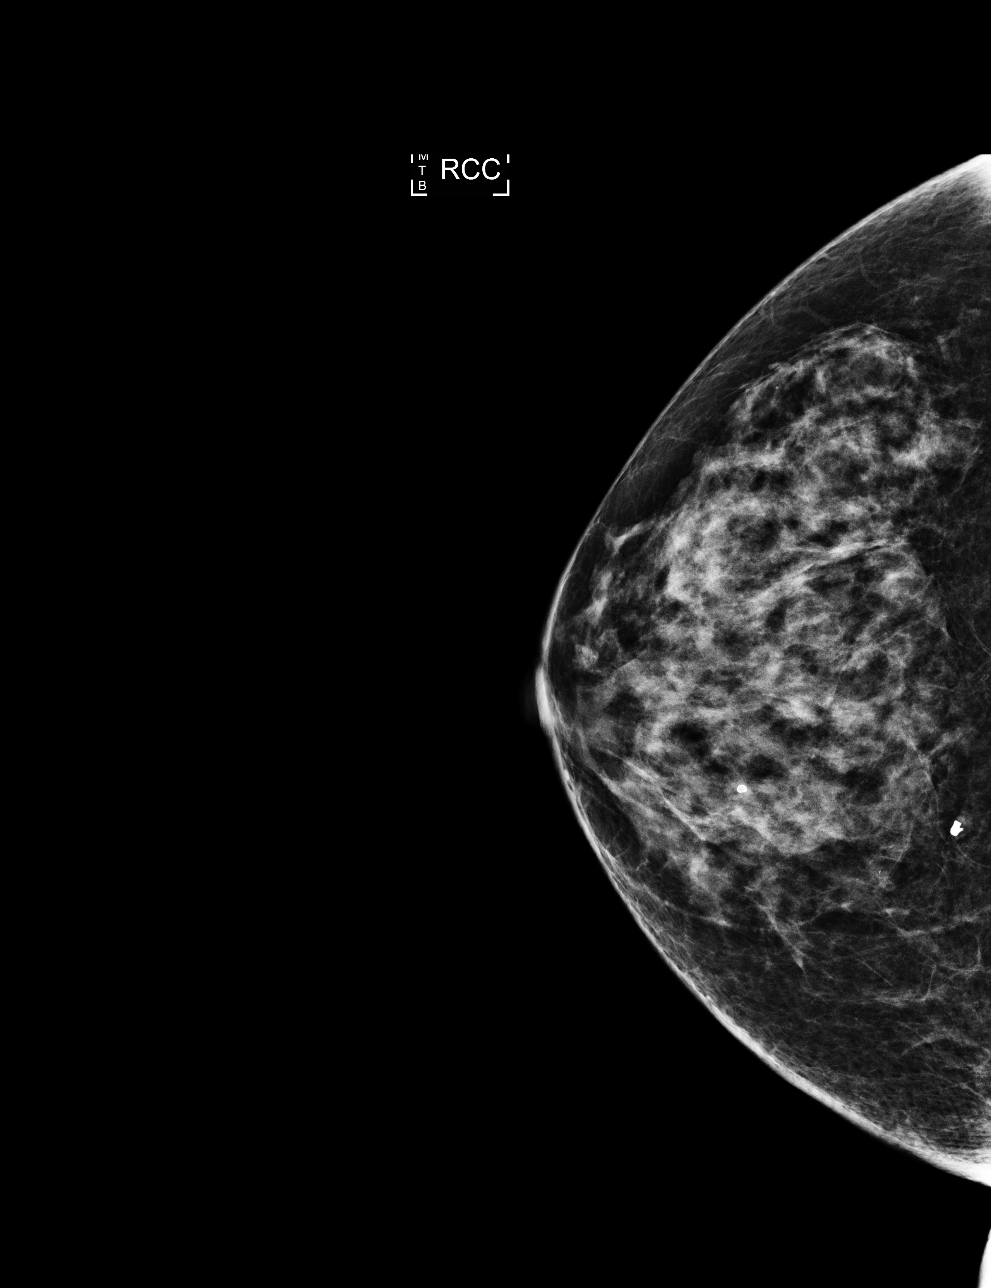

[R MLO]
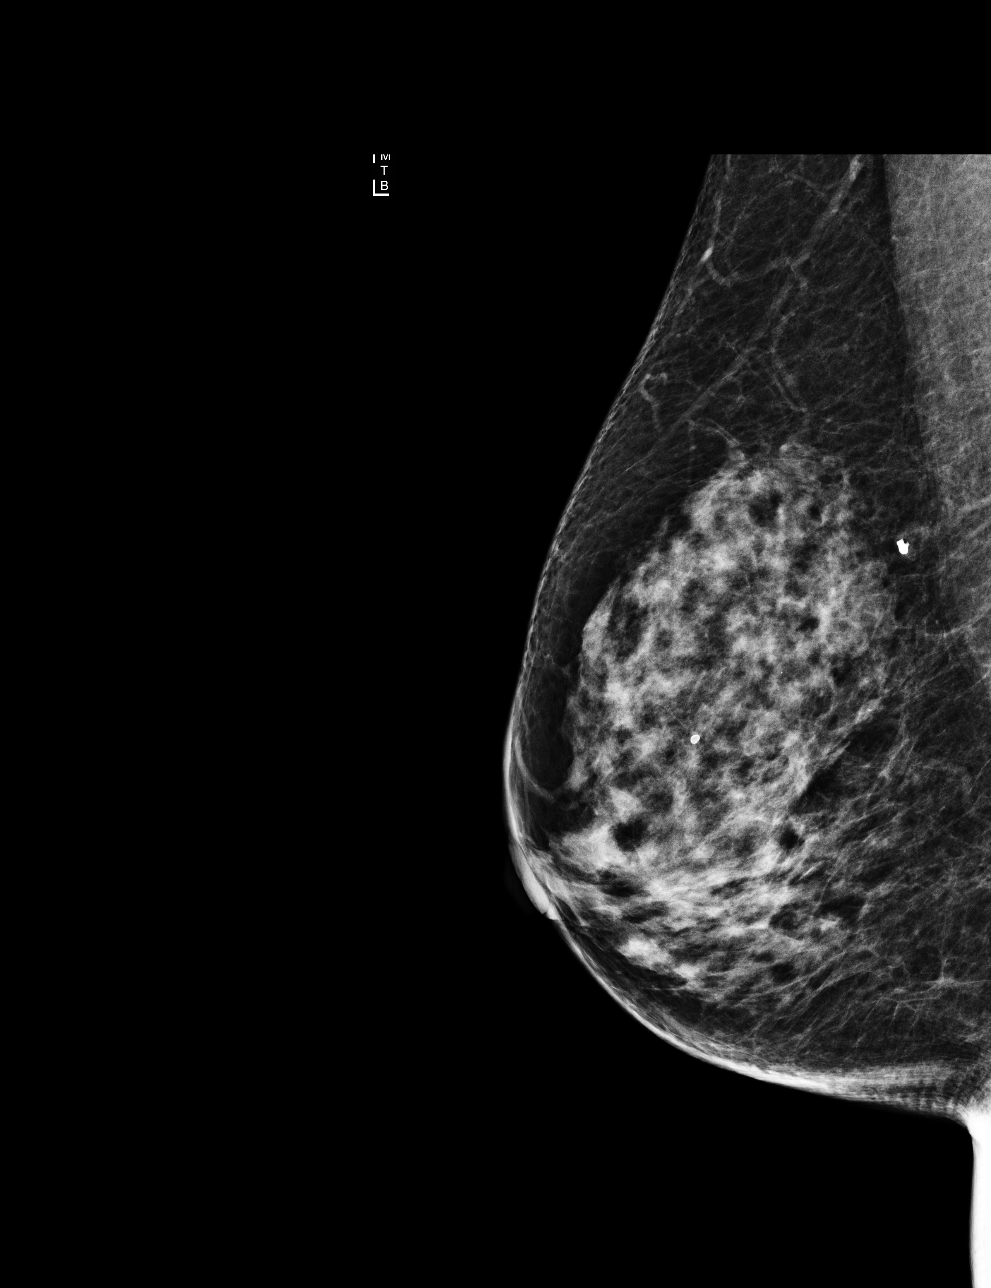

[L MLO]
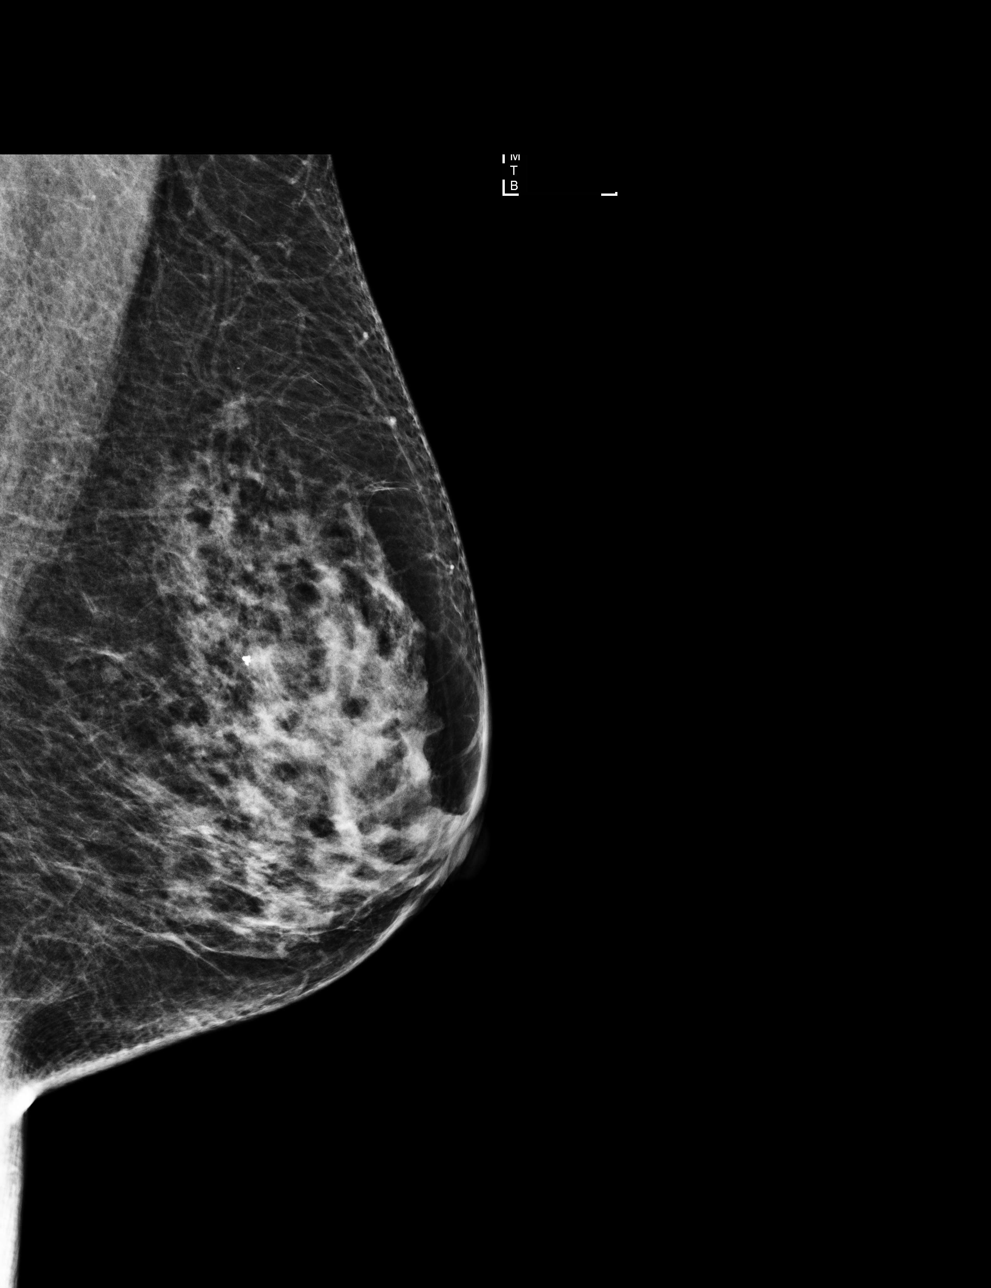

[L CC]
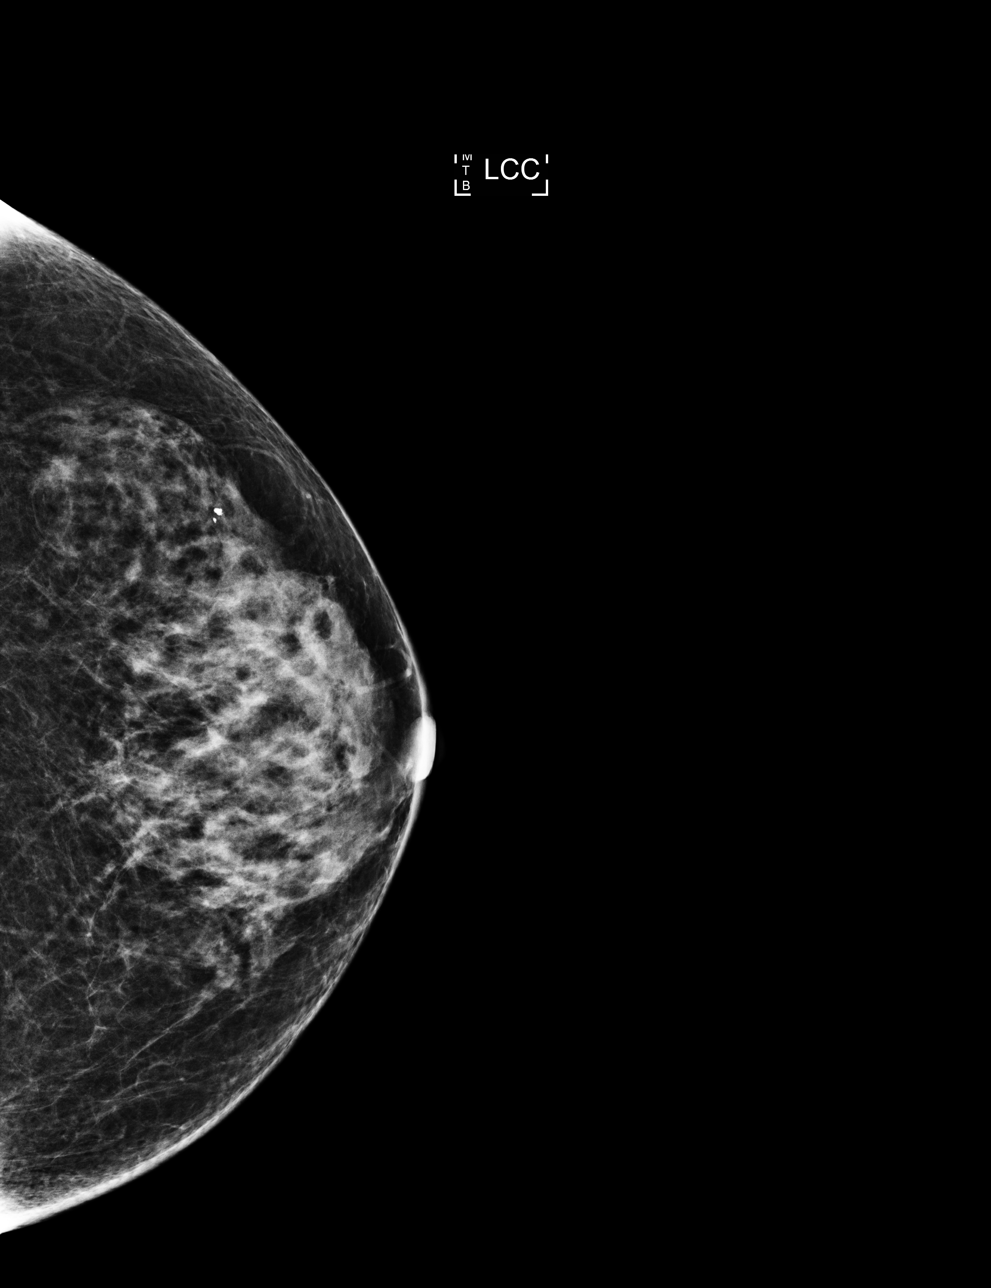

[4 of 4 positions shown; findings below may reference images not displayed]

ACR Breast Density Category c: The breast tissue is heterogeneously
dense, which may obscure small masses.
FINDINGS: There are no findings suspicious for malignancy. Images were
processed with CAD.
IMPRESSION: No mammographic evidence of malignancy. A result letter of this
screening mammogram will be mailed directly to the patient.

RECOMMENDATION:
Screening mammogram in one year. (Code:[id number])

BI-RADS CATEGORY  1: Negative.

## 2018-04-18 ENCOUNTER — Ambulatory Visit
Admission: RE | Admit: 2018-04-18 | Discharge: 2018-04-18 | Disposition: A | Discharge status: Home / Self Care | Payer: Medicare Other | Source: Ambulatory Visit | Attending: Family Medicine | Admitting: Family Medicine

## 2018-04-18 DIAGNOSIS — Z1231 Encounter for screening mammogram for malignant neoplasm of breast: Secondary | ICD-10-CM

## 2018-05-03 DIAGNOSIS — H25012 Cortical age-related cataract, left eye: Secondary | ICD-10-CM | POA: Diagnosis not present

## 2018-05-03 DIAGNOSIS — H26491 Other secondary cataract, right eye: Secondary | ICD-10-CM | POA: Diagnosis not present

## 2018-05-03 DIAGNOSIS — H2512 Age-related nuclear cataract, left eye: Secondary | ICD-10-CM | POA: Diagnosis not present

## 2018-05-03 DIAGNOSIS — H5010 Unspecified exotropia: Secondary | ICD-10-CM | POA: Diagnosis not present

## 2018-05-07 DIAGNOSIS — K3189 Other diseases of stomach and duodenum: Secondary | ICD-10-CM | POA: Diagnosis not present

## 2018-05-07 DIAGNOSIS — Z800 Family history of malignant neoplasm of digestive organs: Secondary | ICD-10-CM | POA: Diagnosis not present

## 2018-05-07 DIAGNOSIS — R1013 Epigastric pain: Secondary | ICD-10-CM | POA: Diagnosis not present

## 2018-05-09 DIAGNOSIS — I6522 Occlusion and stenosis of left carotid artery: Secondary | ICD-10-CM | POA: Diagnosis not present

## 2018-05-09 DIAGNOSIS — E7800 Pure hypercholesterolemia, unspecified: Secondary | ICD-10-CM | POA: Diagnosis not present

## 2018-05-09 DIAGNOSIS — K2970 Gastritis, unspecified, without bleeding: Secondary | ICD-10-CM | POA: Diagnosis not present

## 2018-05-09 DIAGNOSIS — R7303 Prediabetes: Secondary | ICD-10-CM | POA: Diagnosis not present

## 2018-05-09 DIAGNOSIS — G4730 Sleep apnea, unspecified: Secondary | ICD-10-CM | POA: Diagnosis not present

## 2018-05-09 DIAGNOSIS — Z1211 Encounter for screening for malignant neoplasm of colon: Secondary | ICD-10-CM | POA: Diagnosis not present

## 2018-05-09 DIAGNOSIS — Z1389 Encounter for screening for other disorder: Secondary | ICD-10-CM | POA: Diagnosis not present

## 2018-05-09 DIAGNOSIS — Z0000 Encounter for general adult medical examination without abnormal findings: Secondary | ICD-10-CM | POA: Diagnosis not present

## 2018-05-09 DIAGNOSIS — Z23 Encounter for immunization: Secondary | ICD-10-CM | POA: Diagnosis not present

## 2018-05-09 DIAGNOSIS — M810 Age-related osteoporosis without current pathological fracture: Secondary | ICD-10-CM | POA: Diagnosis not present

## 2018-05-14 ENCOUNTER — Encounter: Payer: Self-pay | Admitting: Adult Health

## 2018-05-15 ENCOUNTER — Ambulatory Visit (INDEPENDENT_AMBULATORY_CARE_PROVIDER_SITE_OTHER): Payer: Medicare Other | Admitting: Adult Health

## 2018-05-15 ENCOUNTER — Encounter: Payer: Self-pay | Admitting: Adult Health

## 2018-05-15 VITALS — BP 122/64 | HR 76 | Ht 60.0 in | Wt 122.0 lb

## 2018-05-15 DIAGNOSIS — Z9989 Dependence on other enabling machines and devices: Secondary | ICD-10-CM

## 2018-05-15 DIAGNOSIS — G4733 Obstructive sleep apnea (adult) (pediatric): Secondary | ICD-10-CM

## 2018-05-15 DIAGNOSIS — I6522 Occlusion and stenosis of left carotid artery: Secondary | ICD-10-CM | POA: Diagnosis not present

## 2018-05-15 NOTE — Progress Notes (Signed)
PATIENT: Heather Page DOB: 30-Nov-1945  REASON FOR VISIT: follow up- OSA on CPAP HISTORY FROM: patient  HISTORY OF PRESENT ILLNESS: Today 05/15/18:  Heather Page is a 72 year old female with a history of obstructive sleep apnea on CPAP.  Her CPAP download indicates that she use her machine 30 out of 30 days for compliance of 100%.  She use her machine each night greater than 4 hours.  On average she uses her machine 7 hours and 27 minutes.  Her residual AHI is 0.8 on 5 cm of water.  She does not have a significant leak.  She reports her machine continues to work well.  Her Epworth sleepiness score is 3.  She returns today for evaluation.   REVIEW OF SYSTEMS: Out of a complete 14 system review of symptoms, the patient complains only of the following symptoms, and all other reviewed systems are negative.  ALLERGIES: No Known Allergies  HOME MEDICATIONS: Outpatient Medications Prior to Visit  Medication Sig Dispense Refill  . glucosamine-chondroitin 500-400 MG tablet Take 1 tablet by mouth 3 (three) times daily.    Marland Kitchen ibandronate (BONIVA) 150 MG tablet Take 150 mg by mouth every 30 (thirty) days. Take in the morning with a full glass of water, on an empty stomach, and do not take anything else by mouth or lie down for the next 30 min.    . Omega-3 Fatty Acids (FISH OIL PO) Take by mouth.    Marland Kitchen omeprazole (PRILOSEC) 20 MG capsule Take 20 mg by mouth daily.    . Calcium Carb-Cholecalciferol (CALCIUM 600 + D PO) Take by mouth.     No facility-administered medications prior to visit.     PAST MEDICAL HISTORY: Past Medical History:  Diagnosis Date  . OSA (obstructive sleep apnea)   . OSA on CPAP 04/13/2016  . Thyroid disease     PAST SURGICAL HISTORY: No past surgical history on file.  FAMILY HISTORY: Family History  Problem Relation Age of Onset  . Diabetes Mother     SOCIAL HISTORY: Social History   Socioeconomic History  . Marital status: Married    Spouse name: Not on file    . Number of children: Not on file  . Years of education: Not on file  . Highest education level: Not on file  Occupational History  . Not on file  Social Needs  . Financial resource strain: Not on file  . Food insecurity:    Worry: Not on file    Inability: Not on file  . Transportation needs:    Medical: Not on file    Non-medical: Not on file  Tobacco Use  . Smoking status: Never Smoker  . Smokeless tobacco: Never Used  Substance and Sexual Activity  . Alcohol use: No    Alcohol/week: 0.0 standard drinks  . Drug use: No  . Sexual activity: Not on file  Lifestyle  . Physical activity:    Days per week: Not on file    Minutes per session: Not on file  . Stress: Not on file  Relationships  . Social connections:    Talks on phone: Not on file    Gets together: Not on file    Attends religious service: Not on file    Active member of club or organization: Not on file    Attends meetings of clubs or organizations: Not on file    Relationship status: Not on file  . Intimate partner violence:    Fear of current  or ex partner: Not on file    Emotionally abused: Not on file    Physically abused: Not on file    Forced sexual activity: Not on file  Other Topics Concern  . Not on file  Social History Narrative   Denies caffeine use.      PHYSICAL EXAM  Vitals:   05/15/18 1407  BP: 122/64  Pulse: 76  Weight: 122 lb (55.3 kg)  Height: 5' (1.524 m)   Body mass index is 23.83 kg/m.  Generalized: Well developed, in no acute distress   Neurological examination  Mentation: Alert oriented to time, place, history taking. Follows all commands speech and language fluent Cranial nerve II-XII: Pupils were equal round reactive to light. Extraocular movements were full, visual field were full on confrontational test. Facial sensation and strength were normal. Uvula tongue midline. Head turning and shoulder shrug  were normal and symmetric.  Neck circumference 13-1/2 inches,  Mallampati 2+ Motor: The motor testing reveals 5 over 5 strength of all 4 extremities. Good symmetric motor tone is noted throughout.  Sensory: Sensory testing is intact to soft touch on all 4 extremities. No evidence of extinction is noted.  Coordination: Cerebellar testing reveals good finger-nose-finger and heel-to-shin bilaterally.  Gait and station: Gait is normal.  Reflexes: Deep tendon reflexes are symmetric and normal bilaterally.   DIAGNOSTIC DATA (LABS, IMAGING, TESTING) - I reviewed patient records, labs, notes, testing and imaging myself where available.     ASSESSMENT AND PLAN 72 y.o. year old female  has a past medical history of OSA (obstructive sleep apnea), OSA on CPAP (04/13/2016), and Thyroid disease. here with:  1.  Obstructive sleep apnea on CPAP  The patient CPAP download indicates that she has good compliance and good treatment of her apnea.  She is encouraged to continue using the CPAP nightly and greater than 4 hours each night.  I have advised the patient that if her symptoms worsen or she develops new symptoms she should let us know.  She will follow-up in 1 year or sooner if needed.  I spent 15 minutes with the patient. 50% of this time was spent reviewing CPAP download   Ward Givens, MSN, NP-C 05/15/2018, 3:02 PM [facility] [address], [place], [place] [address] [phone]

## 2018-05-15 NOTE — Patient Instructions (Signed)
Your Plan:  Continue using CPAP nightly and >4 hours each night If your symptoms worsen or you develop new symptoms please let us know.   Thank you for coming to see us at [facility]. I hope we have been able to provide you high quality care today.  You may receive a patient satisfaction survey over the next few weeks. We would appreciate your feedback and comments so that we may continue to improve ourselves and the health of our patients.  

## 2018-05-15 NOTE — Progress Notes (Signed)
I have read the note, and I agree with the clinical assessment and plan.  Bethel Sirois K Karolee Meloni   

## 2018-06-05 ENCOUNTER — Encounter: Payer: Self-pay | Admitting: Adult Health

## 2018-09-17 DIAGNOSIS — J322 Chronic ethmoidal sinusitis: Secondary | ICD-10-CM | POA: Diagnosis not present

## 2018-09-17 DIAGNOSIS — J040 Acute laryngitis: Secondary | ICD-10-CM | POA: Diagnosis not present

## 2018-09-17 DIAGNOSIS — E041 Nontoxic single thyroid nodule: Secondary | ICD-10-CM | POA: Diagnosis not present

## 2018-09-17 DIAGNOSIS — J320 Chronic maxillary sinusitis: Secondary | ICD-10-CM | POA: Diagnosis not present

## 2018-09-19 ENCOUNTER — Other Ambulatory Visit: Payer: Self-pay | Admitting: Otolaryngology

## 2018-09-19 DIAGNOSIS — E041 Nontoxic single thyroid nodule: Secondary | ICD-10-CM

## 2018-09-23 ENCOUNTER — Other Ambulatory Visit: Payer: Self-pay | Admitting: Otolaryngology

## 2018-09-23 DIAGNOSIS — E041 Nontoxic single thyroid nodule: Secondary | ICD-10-CM

## 2018-10-15 ENCOUNTER — Ambulatory Visit
Admission: RE | Admit: 2018-10-15 | Discharge: 2018-10-15 | Disposition: A | Discharge status: Home / Self Care | Payer: Medicare Other | Source: Ambulatory Visit | Attending: Otolaryngology | Admitting: Otolaryngology

## 2018-10-15 DIAGNOSIS — E042 Nontoxic multinodular goiter: Secondary | ICD-10-CM | POA: Diagnosis not present

## 2018-10-15 DIAGNOSIS — E041 Nontoxic single thyroid nodule: Secondary | ICD-10-CM

## 2019-05-19 ENCOUNTER — Ambulatory Visit: Payer: Medicare Other | Admitting: Adult Health

## 2019-06-30 DIAGNOSIS — Z23 Encounter for immunization: Secondary | ICD-10-CM | POA: Diagnosis not present

## 2019-10-30 ENCOUNTER — Ambulatory Visit: Payer: Medicare Other

## 2019-11-03 ENCOUNTER — Ambulatory Visit: Payer: Medicare Other | Admitting: Adult Health

## 2019-11-08 ENCOUNTER — Ambulatory Visit: Payer: Medicare Other | Attending: Internal Medicine

## 2019-11-08 DIAGNOSIS — Z23 Encounter for immunization: Secondary | ICD-10-CM

## 2019-11-08 NOTE — Progress Notes (Signed)
   Covid-19 Vaccination Clinic  Name:  Heather Page    MRN: [id number] DOB: [birth year]  11/08/2019  [name] was observed post Covid-19 immunization for 15 minutes without incidence. She was provided with Vaccine Information Sheet and instruction to access the V-Safe system.   [name] was instructed to call 911 with any severe reactions post vaccine: Marland Kitchen Difficulty breathing  . Swelling of your face and throat  . A fast heartbeat  . A bad rash all over your body  . Dizziness and weakness    Immunizations Administered    Name Date Dose VIS Date Route   Pfizer COVID-19 Vaccine 11/08/2019  9:40 AM 0.3 mL 09/12/2019 Intramuscular   Manufacturer: [place]   Lot: [id number]   [place]: [id number]

## 2019-11-10 ENCOUNTER — Ambulatory Visit: Payer: Medicare Other

## 2019-12-03 ENCOUNTER — Ambulatory Visit: Payer: Medicare Other | Attending: Internal Medicine

## 2019-12-03 DIAGNOSIS — Z23 Encounter for immunization: Secondary | ICD-10-CM

## 2019-12-03 NOTE — Progress Notes (Signed)
   Covid-19 Vaccination Clinic  Name:  Heather Page    MRN: [id number] DOB: 08-08-1946  12/03/2019  [name] was observed post Covid-19 immunization for 15 minutes without incident. She was provided with Vaccine Information Sheet and instruction to access the V-Safe system.   [name] was instructed to call 911 with any severe reactions post vaccine: Marland Kitchen Difficulty breathing  . Swelling of face and throat  . A fast heartbeat  . A bad rash all over body  . Dizziness and weakness   Immunizations Administered    Name Date Dose VIS Date Route   Pfizer COVID-19 Vaccine 12/03/2019  9:34 AM 0.3 mL 09/12/2019 Intramuscular   Manufacturer: [place]   Lot: [id number]   [place]: [id number]

## 2020-01-10 ENCOUNTER — Encounter: Payer: Self-pay | Admitting: Adult Health

## 2020-02-02 DIAGNOSIS — H524 Presbyopia: Secondary | ICD-10-CM | POA: Diagnosis not present

## 2020-02-02 DIAGNOSIS — H04123 Dry eye syndrome of bilateral lacrimal glands: Secondary | ICD-10-CM | POA: Diagnosis not present

## 2020-02-02 DIAGNOSIS — H2512 Age-related nuclear cataract, left eye: Secondary | ICD-10-CM | POA: Diagnosis not present

## 2020-02-02 DIAGNOSIS — H25012 Cortical age-related cataract, left eye: Secondary | ICD-10-CM | POA: Diagnosis not present

## 2020-02-09 ENCOUNTER — Encounter: Payer: Self-pay | Admitting: Adult Health

## 2020-02-09 ENCOUNTER — Other Ambulatory Visit: Payer: Self-pay | Admitting: Family Medicine

## 2020-02-09 ENCOUNTER — Other Ambulatory Visit: Payer: Self-pay

## 2020-02-09 ENCOUNTER — Ambulatory Visit (INDEPENDENT_AMBULATORY_CARE_PROVIDER_SITE_OTHER): Payer: Medicare Other | Admitting: Adult Health

## 2020-02-09 VITALS — BP 127/63 | HR 73 | Temp 97.0°F | Ht 62.0 in | Wt 117.0 lb

## 2020-02-09 DIAGNOSIS — Z9989 Dependence on other enabling machines and devices: Secondary | ICD-10-CM

## 2020-02-09 DIAGNOSIS — G4733 Obstructive sleep apnea (adult) (pediatric): Secondary | ICD-10-CM | POA: Diagnosis not present

## 2020-02-09 DIAGNOSIS — Z1231 Encounter for screening mammogram for malignant neoplasm of breast: Secondary | ICD-10-CM

## 2020-02-09 NOTE — Patient Instructions (Signed)
Continue using CPAP nightly and greater than 4 hours each night °If your symptoms worsen or you develop new symptoms please let us know.  ° °

## 2020-02-09 NOTE — Progress Notes (Signed)
Heather Page: Heather Page DOB: July 01, 1946  REASON FOR VISIT: follow up HISTORY FROM: Heather Page  HISTORY OF PRESENT ILLNESS: Today 02/09/20:  Heather Page is a 74 year old female with a history of obstructive sleep apnea on CPAP.  Her download indicates that she use her machine nightly for compliance of 100%.  She use her machine greater than 4 hours each night.  On average she uses her machine 7 hours and 51 minutes.  Her residual AHI is 0.8 on 7 cm of water with EPR 3.  She does not have a significant leak.  Husband reports that she would like a new machine.  Reports that hers is  74 years old.  She returns today for an evaluation.  HISTORY 05/15/18:  Heather Page is a 74 year old female with a history of obstructive sleep apnea on CPAP.  Her CPAP download indicates that she use her machine 30 out of 30 days for compliance of 100%.  She use her machine each night greater than 4 hours.  On average she uses her machine 7 hours and 27 minutes.  Her residual AHI is 0.8 on 5 cm of water.  She does not have a significant leak.  She reports her machine continues to work well.  Her Epworth sleepiness score is 3.  She returns today for evaluation.  REVIEW OF SYSTEMS: Out of a complete 14 system review of symptoms, the Heather Page complains only of the following symptoms, and all other reviewed systems are negative.  See HPI  ALLERGIES: No Known Allergies  HOME MEDICATIONS: Outpatient Medications Prior to Visit  Medication Sig Dispense Refill  . glucosamine-chondroitin 500-400 MG tablet Take 1 tablet by mouth 3 (three) times daily.    Marland Kitchen ibandronate (BONIVA) 150 MG tablet Take 150 mg by mouth every 30 (thirty) days. Take in the morning with a full glass of water, on an empty stomach, and do not take anything else by mouth or lie down for the next 30 min.    . Omega-3 Fatty Acids (FISH OIL PO) Take by mouth.    Marland Kitchen omeprazole (PRILOSEC) 20 MG capsule Take 20 mg by mouth daily.     No facility-administered  medications prior to visit.    PAST MEDICAL HISTORY: Past Medical History:  Diagnosis Date  . OSA (obstructive sleep apnea)   . OSA on CPAP 04/13/2016  . Thyroid disease     PAST SURGICAL HISTORY: No past surgical history on file.  FAMILY HISTORY: Family History  Problem Relation Age of Onset  . Diabetes Mother     SOCIAL HISTORY: Social History   Socioeconomic History  . Marital status: Married    Spouse name: Not on file  . Number of children: Not on file  . Years of education: Not on file  . Highest education level: Not on file  Occupational History  . Not on file  Tobacco Use  . Smoking status: Never Smoker  . Smokeless tobacco: Never Used  Substance and Sexual Activity  . Alcohol use: No    Alcohol/week: 0.0 standard drinks  . Drug use: No  . Sexual activity: Not on file  Other Topics Concern  . Not on file  Social History Narrative   Denies caffeine use.   Social Determinants of Health   Financial Resource Strain:   . Difficulty of Paying Living Expenses:   Food Insecurity:   . Worried About Charity fundraiser in the Last Year:   . [place] in the Last  Year:   Transportation Needs:   . Film/video editor (Medical):   Marland Kitchen Lack of Transportation (Non-Medical):   Physical Activity:   . Days of Exercise per Week:   . Minutes of Exercise per Session:   Stress:   . Feeling of Stress :   Social Connections:   . Frequency of Communication with Friends and Family:   . Frequency of Social Gatherings with Friends and Family:   . Attends Religious Services:   . Active Member of Clubs or Organizations:   . Attends Archivist Meetings:   Marland Kitchen Marital Status:   Intimate Partner Violence:   . Fear of Current or Ex-Partner:   . Emotionally Abused:   Marland Kitchen Physically Abused:   . Sexually Abused:       PHYSICAL EXAM  Vitals:   02/09/20 0856  BP: 127/63  Pulse: 73  Temp: (!) 97 F (36.1 C)  Weight: 117 lb (53.1 kg)  Height: 5\' 2"   (1.575 m)   Body mass index is 21.4 kg/m.  Generalized: Well developed, in no acute distress  Chest: Lungs clear to auscultation bilaterally  Neurological examination  Mentation: Alert oriented to time, place, history taking. Follows all commands speech and language fluent Cranial nerve II-XII: Extraocular movements were full, visual field were full on confrontational test Head turning and shoulder shrug  were normal and symmetric. Motor: The motor testing reveals 5 over 5 strength of all 4 extremities. Good symmetric motor tone is noted throughout.  Sensory: Sensory testing is intact to soft touch on all 4 extremities. No evidence of extinction is noted.  Gait and station: Gait is normal.    DIAGNOSTIC DATA (LABS, IMAGING, TESTING) - I reviewed Heather Page records, labs, notes, testing and imaging myself where available.    ASSESSMENT AND PLAN 74 y.o. year old female  has a past medical history of OSA (obstructive sleep apnea), OSA on CPAP (04/13/2016), and Thyroid disease. here with:  1. OSA on CPAP  - CPAP compliance excellent - Good treatment of AHI  - Encourage Heather Page to use CPAP nightly and > 4 hours each night -Order for new machine placed - F/U in 1 year or sooner if needed   I spent 20 minutes of face-to-face and non-face-to-face time with Heather Page.  This included previsit chart review, lab review, study review, order entry, electronic health record documentation, Heather Page education.  Ward Givens, MSN, NP-C 02/09/2020, 9:06 AM [facility] [address] [place], [place] [address] [phone]

## 2020-02-17 DIAGNOSIS — E7800 Pure hypercholesterolemia, unspecified: Secondary | ICD-10-CM | POA: Diagnosis not present

## 2020-02-17 DIAGNOSIS — Z0000 Encounter for general adult medical examination without abnormal findings: Secondary | ICD-10-CM | POA: Diagnosis not present

## 2020-02-17 DIAGNOSIS — R7303 Prediabetes: Secondary | ICD-10-CM | POA: Diagnosis not present

## 2020-02-17 DIAGNOSIS — G4730 Sleep apnea, unspecified: Secondary | ICD-10-CM | POA: Diagnosis not present

## 2020-02-17 DIAGNOSIS — Z1389 Encounter for screening for other disorder: Secondary | ICD-10-CM | POA: Diagnosis not present

## 2020-02-17 DIAGNOSIS — I6522 Occlusion and stenosis of left carotid artery: Secondary | ICD-10-CM | POA: Diagnosis not present

## 2020-02-17 DIAGNOSIS — Z1211 Encounter for screening for malignant neoplasm of colon: Secondary | ICD-10-CM | POA: Diagnosis not present

## 2020-02-17 DIAGNOSIS — E041 Nontoxic single thyroid nodule: Secondary | ICD-10-CM | POA: Diagnosis not present

## 2020-02-17 DIAGNOSIS — M810 Age-related osteoporosis without current pathological fracture: Secondary | ICD-10-CM | POA: Diagnosis not present

## 2020-02-17 DIAGNOSIS — K2970 Gastritis, unspecified, without bleeding: Secondary | ICD-10-CM | POA: Diagnosis not present

## 2020-02-17 LAB — TSH: TSH: 0.79 (ref <=5.90)

## 2020-02-17 LAB — VITAMIN D 25 HYDROXY (VIT D DEFICIENCY, FRACTURES): Vit D, 25-Hydroxy: 59.1

## 2020-02-19 ENCOUNTER — Other Ambulatory Visit: Payer: Self-pay | Admitting: Family Medicine

## 2020-02-19 DIAGNOSIS — E041 Nontoxic single thyroid nodule: Secondary | ICD-10-CM

## 2020-02-23 ENCOUNTER — Other Ambulatory Visit: Payer: Self-pay | Admitting: Family Medicine

## 2020-02-23 DIAGNOSIS — M810 Age-related osteoporosis without current pathological fracture: Secondary | ICD-10-CM

## 2020-03-03 ENCOUNTER — Telehealth: Payer: Self-pay

## 2020-03-03 NOTE — Telephone Encounter (Signed)
Attempted to contact the patient re:   Needs set-up appt between 03/27/20 and 05/25/2020 for compliance per at the request of [employer] [facility] .  Pt was not available. LM on the VM for the pt to call back.   If he calls back please schedule an appt with Jinny Blossom NP

## 2020-03-04 NOTE — Telephone Encounter (Signed)
Wen,Jing(husband) has called and pt has been scheduled for 08-17 with a 2:30 check in for 3:00 appointment

## 2020-03-22 ENCOUNTER — Ambulatory Visit
Admission: RE | Admit: 2020-03-22 | Discharge: 2020-03-22 | Disposition: A | Discharge status: Home / Self Care | Payer: Medicare Other | Source: Ambulatory Visit | Attending: Family Medicine | Admitting: Family Medicine

## 2020-03-22 ENCOUNTER — Other Ambulatory Visit: Payer: Self-pay

## 2020-03-22 DIAGNOSIS — Z1231 Encounter for screening mammogram for malignant neoplasm of breast: Secondary | ICD-10-CM

## 2020-03-24 ENCOUNTER — Ambulatory Visit
Admission: RE | Admit: 2020-03-24 | Discharge: 2020-03-24 | Disposition: A | Discharge status: Home / Self Care | Payer: Medicare Other | Source: Ambulatory Visit | Attending: Family Medicine | Admitting: Family Medicine

## 2020-03-24 DIAGNOSIS — E041 Nontoxic single thyroid nodule: Secondary | ICD-10-CM

## 2020-04-02 ENCOUNTER — Other Ambulatory Visit: Payer: Self-pay | Admitting: Family Medicine

## 2020-04-02 DIAGNOSIS — E041 Nontoxic single thyroid nodule: Secondary | ICD-10-CM

## 2020-04-15 ENCOUNTER — Other Ambulatory Visit: Payer: Self-pay

## 2020-04-15 ENCOUNTER — Ambulatory Visit
Admission: RE | Admit: 2020-04-15 | Discharge: 2020-04-15 | Disposition: A | Discharge status: Home / Self Care | Payer: Medicare Other | Source: Ambulatory Visit | Attending: Family Medicine | Admitting: Family Medicine

## 2020-04-15 ENCOUNTER — Other Ambulatory Visit (HOSPITAL_COMMUNITY)
Admission: RE | Admit: 2020-04-15 | Discharge: 2020-04-15 | Disposition: A | Discharge status: Home / Self Care | Payer: Medicare Other | Source: Ambulatory Visit | Attending: Family Medicine | Admitting: Family Medicine

## 2020-04-15 DIAGNOSIS — E041 Nontoxic single thyroid nodule: Secondary | ICD-10-CM | POA: Diagnosis not present

## 2020-04-19 LAB — CYTOLOGY - NON PAP

## 2020-04-29 DIAGNOSIS — D123 Benign neoplasm of transverse colon: Secondary | ICD-10-CM | POA: Diagnosis not present

## 2020-04-29 DIAGNOSIS — K2930 Chronic superficial gastritis without bleeding: Secondary | ICD-10-CM | POA: Diagnosis not present

## 2020-04-29 DIAGNOSIS — K644 Residual hemorrhoidal skin tags: Secondary | ICD-10-CM | POA: Diagnosis not present

## 2020-04-29 DIAGNOSIS — Z1211 Encounter for screening for malignant neoplasm of colon: Secondary | ICD-10-CM | POA: Diagnosis not present

## 2020-04-29 DIAGNOSIS — Z800 Family history of malignant neoplasm of digestive organs: Secondary | ICD-10-CM | POA: Diagnosis not present

## 2020-04-29 DIAGNOSIS — K2940 Chronic atrophic gastritis without bleeding: Secondary | ICD-10-CM | POA: Diagnosis not present

## 2020-04-29 DIAGNOSIS — K3189 Other diseases of stomach and duodenum: Secondary | ICD-10-CM | POA: Diagnosis not present

## 2020-04-29 DIAGNOSIS — K648 Other hemorrhoids: Secondary | ICD-10-CM | POA: Diagnosis not present

## 2020-04-29 DIAGNOSIS — K2970 Gastritis, unspecified, without bleeding: Secondary | ICD-10-CM | POA: Diagnosis not present

## 2020-04-29 DIAGNOSIS — K635 Polyp of colon: Secondary | ICD-10-CM | POA: Diagnosis not present

## 2020-05-06 DIAGNOSIS — K635 Polyp of colon: Secondary | ICD-10-CM | POA: Diagnosis not present

## 2020-05-06 DIAGNOSIS — K2940 Chronic atrophic gastritis without bleeding: Secondary | ICD-10-CM | POA: Diagnosis not present

## 2020-05-06 DIAGNOSIS — D123 Benign neoplasm of transverse colon: Secondary | ICD-10-CM | POA: Diagnosis not present

## 2020-05-06 DIAGNOSIS — K2930 Chronic superficial gastritis without bleeding: Secondary | ICD-10-CM | POA: Diagnosis not present

## 2020-05-11 ENCOUNTER — Other Ambulatory Visit: Payer: Self-pay

## 2020-05-11 ENCOUNTER — Ambulatory Visit
Admission: RE | Admit: 2020-05-11 | Discharge: 2020-05-11 | Disposition: A | Discharge status: Home / Self Care | Payer: Medicare Other | Source: Ambulatory Visit | Attending: Family Medicine | Admitting: Family Medicine

## 2020-05-11 DIAGNOSIS — M8589 Other specified disorders of bone density and structure, multiple sites: Secondary | ICD-10-CM | POA: Diagnosis not present

## 2020-05-11 DIAGNOSIS — Z780 Asymptomatic menopausal state: Secondary | ICD-10-CM | POA: Diagnosis not present

## 2020-05-11 DIAGNOSIS — M810 Age-related osteoporosis without current pathological fracture: Secondary | ICD-10-CM

## 2020-05-12 ENCOUNTER — Encounter: Payer: Self-pay | Admitting: Endocrinology

## 2020-05-18 ENCOUNTER — Ambulatory Visit: Payer: Medicare Other | Admitting: Adult Health

## 2020-06-16 ENCOUNTER — Encounter: Payer: Self-pay | Admitting: Adult Health

## 2020-06-16 ENCOUNTER — Ambulatory Visit (INDEPENDENT_AMBULATORY_CARE_PROVIDER_SITE_OTHER): Payer: Medicare Other | Admitting: Adult Health

## 2020-06-16 ENCOUNTER — Ambulatory Visit (INDEPENDENT_AMBULATORY_CARE_PROVIDER_SITE_OTHER): Payer: Medicare Other | Admitting: Endocrinology

## 2020-06-16 ENCOUNTER — Encounter: Payer: Self-pay | Admitting: Endocrinology

## 2020-06-16 ENCOUNTER — Other Ambulatory Visit: Payer: Self-pay

## 2020-06-16 VITALS — BP 103/58 | HR 71 | Ht 60.0 in | Wt 113.6 lb

## 2020-06-16 DIAGNOSIS — Z9989 Dependence on other enabling machines and devices: Secondary | ICD-10-CM | POA: Diagnosis not present

## 2020-06-16 DIAGNOSIS — G4733 Obstructive sleep apnea (adult) (pediatric): Secondary | ICD-10-CM | POA: Diagnosis not present

## 2020-06-16 DIAGNOSIS — E042 Nontoxic multinodular goiter: Secondary | ICD-10-CM

## 2020-06-16 NOTE — Progress Notes (Signed)
Subjective:    Patient ID: Heather Page, female    DOB: 04-06-1946, 74 y.o.   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  History is from husband, due to language.  Pt was noted to have a nodule at the thyroid in 2001.  she has no h/o XRT or surgery to the neck.  She does not notice the goiter.   Past Medical History:  Diagnosis Date  . OSA (obstructive sleep apnea)   . OSA on CPAP 04/13/2016  . Thyroid disease     No past surgical history on file.  Social History   Socioeconomic History  . Marital status: Married    Spouse name: Not on file  . Number of children: Not on file  . Years of education: Not on file  . Highest education level: Not on file  Occupational History  . Not on file  Tobacco Use  . Smoking status: Never Smoker  . Smokeless tobacco: Never Used  Substance and Sexual Activity  . Alcohol use: No    Alcohol/week: 0.0 standard drinks  . Drug use: No  . Sexual activity: Not on file  Other Topics Concern  . Not on file  Social History Narrative   Denies caffeine use.   Social Determinants of Health   Financial Resource Strain:   . Difficulty of Paying Living Expenses: Not on file  Food Insecurity:   . Worried About Charity fundraiser in the Last Year: Not on file  . Ran Out of Food in the Last Year: Not on file  Transportation Needs:   . Lack of Transportation (Medical): Not on file  . Lack of Transportation (Non-Medical): Not on file  Physical Activity:   . Days of Exercise per Week: Not on file  . Minutes of Exercise per Session: Not on file  Stress:   . Feeling of Stress : Not on file  Social Connections:   . Frequency of Communication with Friends and Family: Not on file  . Frequency of Social Gatherings with Friends and Family: Not on file  . Attends Religious Services: Not on file  . Active Member of Clubs or Organizations: Not on file  . Attends Archivist Meetings: Not on file  . Marital Status: Not  on file  Intimate Partner Violence:   . Fear of Current or Ex-Partner: Not on file  . Emotionally Abused: Not on file  . Physically Abused: Not on file  . Sexually Abused: Not on file    Current Outpatient Medications on File Prior to Visit  Medication Sig Dispense Refill  . CALCIUM PO Take by mouth.    . MELATONIN PO Take by mouth.    . Multiple Vitamins-Minerals (CENTRUM SILVER PO) Take by mouth.    Marland Kitchen glucosamine-chondroitin 500-400 MG tablet Take 1 tablet by mouth 3 (three) times daily.    Marland Kitchen ibandronate (BONIVA) 150 MG tablet Take 150 mg by mouth every 30 (thirty) days. Take in the morning with a full glass of water, on an empty stomach, and do not take anything else by mouth or lie down for the next 30 min.    . Omega-3 Fatty Acids (FISH OIL PO) Take by mouth.    Marland Kitchen omeprazole (PRILOSEC) 20 MG capsule Take 20 mg by mouth daily.     No current facility-administered medications on file prior to visit.    No Known Allergies  Family History  Problem Relation Age of Onset  .  Diabetes Mother   . Thyroid disease Neg Hx     BP 118/70   Pulse 78   Ht 5' (1.524 m)   Wt 113 lb (51.3 kg)   SpO2 96%   BMI 22.07 kg/m    Review of Systems Denies hoarseness, neck pain, sob, cough, dysphagia, diarrhea, flushing.      Objective:   Physical Exam VITAL SIGNS:  See vs page GENERAL: no distress NECK: 2 cm right thyroid nodule is palpable.  No palpable lymphadenopathy at the anterior neck.     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  Wellness, osteoporosis, dyslipidemia, hyperglycemia, and gastritis were also addressed.      Assessment & Plan:  MNG: I offered to bx here.  She declines.  Plan is for f/u US.   Hyperglycemia: we discussed risk of developing DM.   Patient Instructions  Please come back for a follow-up  appointment in 8 months.   most of the time, a "lumpy thyroid" will eventually become overactive.  this is usually a slow process, happening over the span of many years. Your chances of developing diabetes is about 10% per year.  Diet and exercise reduced this to 5% per year year.  We'll recheck this next time.    ?? 8 ???????????? ???????"?????"?????????? ?????????????????? ???????????? 10%? ??????????????? 5%? ??????????

## 2020-06-16 NOTE — Progress Notes (Signed)
PATIENT: Heather Page DOB: 05/29/1946  REASON FOR VISIT: follow up HISTORY FROM: patient  HISTORY OF PRESENT ILLNESS: Today 06/16/20:  Heather Page is a 74 year old female with a history of obstructive sleep apnea on CPAP.  Her download indicates that she use her machine nightly for compliance of 100%.  She use her machine greater than 4 hours each night.  On average she uses her machine 7 hours and 48 minutes.  Her residual AHI is 0.6 on 5 cm of water with EPR of 3.  Leak in the 95th percentile is 22.4 L/min.  She reports that she got a new machine and is working well for her.  HISTORY 02/09/20:  Heather Page is a 74 year old female with a history of obstructive sleep apnea on CPAP.  Her download indicates that she use her machine nightly for compliance of 100%.  She use her machine greater than 4 hours each night.  On average she uses her machine 7 hours and 51 minutes.  Her residual AHI is 0.8 on 7 cm of water with EPR 3.  She does not have a significant leak.  Heather Page reports that she would like a new machine.  Reports that hers is  74 years old.  She returns today for an evaluation.  REVIEW OF SYSTEMS: Out of a complete 14 system review of symptoms, the patient complains only of the following symptoms, and all other reviewed systems are negative.  FSS 14 ESS 4  ALLERGIES: No Known Allergies  HOME MEDICATIONS: Outpatient Medications Prior to Visit  Medication Sig Dispense Refill  . CALCIUM PO Take by mouth.    Marland Kitchen glucosamine-chondroitin 500-400 MG tablet Take 1 tablet by mouth 3 (three) times daily.    Marland Kitchen ibandronate (BONIVA) 150 MG tablet Take 150 mg by mouth every 30 (thirty) days. Take in the morning with a full glass of water, on an empty stomach, and do not take anything else by mouth or lie down for the next 30 min.    Marland Kitchen MELATONIN PO Take by mouth.    . Multiple Vitamins-Minerals (CENTRUM SILVER PO) Take by mouth.    . Omega-3 Fatty Acids (FISH OIL PO) Take by mouth.    Marland Kitchen  omeprazole (PRILOSEC) 20 MG capsule Take 20 mg by mouth daily.     No facility-administered medications prior to visit.    PAST MEDICAL HISTORY: Past Medical History:  Diagnosis Date  . OSA (obstructive sleep apnea)   . OSA on CPAP 04/13/2016  . Thyroid disease     PAST SURGICAL HISTORY: No past surgical history on file.  FAMILY HISTORY: Family History  Problem Relation Age of Onset  . Diabetes Mother   . Thyroid disease Neg Hx     SOCIAL HISTORY: Social History   Socioeconomic History  . Marital status: Married    Spouse name: Not on file  . Number of children: Not on file  . Years of education: Not on file  . Highest education level: Not on file  Occupational History  . Not on file  Tobacco Use  . Smoking status: Never Smoker  . Smokeless tobacco: Never Used  Substance and Sexual Activity  . Alcohol use: No    Alcohol/week: 0.0 standard drinks  . Drug use: No  . Sexual activity: Not on file  Other Topics Concern  . Not on file  Social History Narrative   Denies caffeine use.   Social Determinants of Health   Financial Resource Strain:   .  Difficulty of Paying Living Expenses: Not on file  Food Insecurity:   . Worried About Charity fundraiser in the Last Year: Not on file  . Ran Out of Food in the Last Year: Not on file  Transportation Needs:   . Lack of Transportation (Medical): Not on file  . Lack of Transportation (Non-Medical): Not on file  Physical Activity:   . Days of Exercise per Week: Not on file  . Minutes of Exercise per Session: Not on file  Stress:   . Feeling of Stress : Not on file  Social Connections:   . Frequency of Communication with Friends and Family: Not on file  . Frequency of Social Gatherings with Friends and Family: Not on file  . Attends Religious Services: Not on file  . Active Member of Clubs or Organizations: Not on file  . Attends Archivist Meetings: Not on file  . Marital Status: Not on file  Intimate  Partner Violence:   . Fear of Current or Ex-Partner: Not on file  . Emotionally Abused: Not on file  . Physically Abused: Not on file  . Sexually Abused: Not on file      PHYSICAL EXAM  Vitals:   06/16/20 1051  BP: (!) 103/58  Pulse: 71  Weight: 113 lb 9.6 oz (51.5 kg)  Height: 5' (1.524 m)   Body mass index is 22.19 kg/m.  Generalized: Well developed, in no acute distress  Chest: Lungs clear to auscultation bilaterally  Neurological examination  Mentation: Alert oriented to time, place, history taking. Follows all commands speech and language fluent Cranial nerve II-XII: Extraocular movements were full, visual field were full on confrontational test Head turning and shoulder shrug  were normal and symmetric. Motor: The motor testing reveals 5 over 5 strength of all 4 extremities. Good symmetric motor tone is noted throughout.  Sensory: Sensory testing is intact to soft touch on all 4 extremities. No evidence of extinction is noted.  Gait and station: Gait is normal.    DIAGNOSTIC DATA (LABS, IMAGING, TESTING) - I reviewed patient records, labs, notes, testing and imaging myself where available.  No results found for: WBC, HGB, HCT, MCV, PLT    Component Value Date/Time   CREATININE 0.70 12/18/2017 1331    Lab Results  Component Value Date   TSH 0.79 02/17/2020      ASSESSMENT AND PLAN 74 y.o. year old female  has a past medical history of OSA (obstructive sleep apnea), OSA on CPAP (04/13/2016), and Thyroid disease. here with:  1. OSA on CPAP  - CPAP compliance excellent - Good treatment of AHI  - Encourage patient to use CPAP nightly and > 4 hours each night - F/U in 1 year or sooner if needed   I spent 25 minutes of face-to-face and non-face-to-face time with patient.  This included previsit chart review, lab review, study review, order entry, electronic health record documentation, patient education.  Ward Givens, MSN, NP-C 06/16/2020, 11:41  AM [facility] [address], [place], [place] [address] [phone]

## 2020-06-16 NOTE — Patient Instructions (Signed)
Please come back for a follow-up appointment in 8 months.   most of the time, a "lumpy thyroid" will eventually become overactive.  this is usually a slow process, happening over the span of many years. Your chances of developing diabetes is about 10% per year.  Diet and exercise reduced this to 5% per year year.  We'll recheck this next time.    ?? 8 ???????????? ???????"?????"?????????? ?????????????????? ???????????? 10%? ??????????????? 5%? ??????????

## 2020-06-19 DIAGNOSIS — E042 Nontoxic multinodular goiter: Secondary | ICD-10-CM | POA: Insufficient documentation

## 2020-07-26 DIAGNOSIS — Z23 Encounter for immunization: Secondary | ICD-10-CM | POA: Diagnosis not present

## 2020-08-11 DIAGNOSIS — Z23 Encounter for immunization: Secondary | ICD-10-CM | POA: Diagnosis not present

## 2020-11-09 ENCOUNTER — Encounter: Payer: Self-pay | Admitting: Neurology

## 2021-02-09 ENCOUNTER — Ambulatory Visit: Payer: Medicare Other | Admitting: Neurology

## 2021-02-16 ENCOUNTER — Ambulatory Visit: Payer: Medicare Other | Admitting: Neurology

## 2021-02-16 ENCOUNTER — Ambulatory Visit: Payer: Medicare Other | Admitting: Endocrinology

## 2021-03-14 ENCOUNTER — Other Ambulatory Visit: Payer: Self-pay | Admitting: Family Medicine

## 2021-03-14 DIAGNOSIS — Z1231 Encounter for screening mammogram for malignant neoplasm of breast: Secondary | ICD-10-CM

## 2021-03-21 ENCOUNTER — Ambulatory Visit: Payer: Medicare Other | Admitting: Endocrinology

## 2021-03-24 DIAGNOSIS — F439 Reaction to severe stress, unspecified: Secondary | ICD-10-CM | POA: Diagnosis not present

## 2021-03-24 DIAGNOSIS — R002 Palpitations: Secondary | ICD-10-CM | POA: Diagnosis not present

## 2021-03-24 DIAGNOSIS — E7800 Pure hypercholesterolemia, unspecified: Secondary | ICD-10-CM | POA: Diagnosis not present

## 2021-03-24 DIAGNOSIS — L989 Disorder of the skin and subcutaneous tissue, unspecified: Secondary | ICD-10-CM | POA: Diagnosis not present

## 2021-03-24 DIAGNOSIS — R7303 Prediabetes: Secondary | ICD-10-CM | POA: Diagnosis not present

## 2021-03-24 DIAGNOSIS — M810 Age-related osteoporosis without current pathological fracture: Secondary | ICD-10-CM | POA: Diagnosis not present

## 2021-03-24 DIAGNOSIS — I6522 Occlusion and stenosis of left carotid artery: Secondary | ICD-10-CM | POA: Diagnosis not present

## 2021-03-24 DIAGNOSIS — K2970 Gastritis, unspecified, without bleeding: Secondary | ICD-10-CM | POA: Diagnosis not present

## 2021-03-24 DIAGNOSIS — Z0000 Encounter for general adult medical examination without abnormal findings: Secondary | ICD-10-CM | POA: Diagnosis not present

## 2021-03-24 DIAGNOSIS — G4730 Sleep apnea, unspecified: Secondary | ICD-10-CM | POA: Diagnosis not present

## 2021-03-24 DIAGNOSIS — Z1389 Encounter for screening for other disorder: Secondary | ICD-10-CM | POA: Diagnosis not present

## 2021-03-24 DIAGNOSIS — Z1211 Encounter for screening for malignant neoplasm of colon: Secondary | ICD-10-CM | POA: Diagnosis not present

## 2021-03-30 DIAGNOSIS — R2231 Localized swelling, mass and lump, right upper limb: Secondary | ICD-10-CM | POA: Diagnosis not present

## 2021-03-31 ENCOUNTER — Inpatient Hospital Stay: Admission: RE | Admit: 2021-03-31 | Payer: Medicare Other | Source: Ambulatory Visit

## 2021-03-31 ENCOUNTER — Ambulatory Visit
Admission: RE | Admit: 2021-03-31 | Discharge: 2021-03-31 | Disposition: A | Discharge status: Home / Self Care | Payer: Medicare Other | Source: Ambulatory Visit | Attending: Family Medicine | Admitting: Family Medicine

## 2021-03-31 ENCOUNTER — Other Ambulatory Visit: Payer: Self-pay

## 2021-03-31 DIAGNOSIS — Z1231 Encounter for screening mammogram for malignant neoplasm of breast: Secondary | ICD-10-CM

## 2021-04-08 DIAGNOSIS — U071 COVID-19: Secondary | ICD-10-CM | POA: Diagnosis not present

## 2021-04-08 DIAGNOSIS — Z20822 Contact with and (suspected) exposure to covid-19: Secondary | ICD-10-CM | POA: Diagnosis not present

## 2021-04-18 ENCOUNTER — Ambulatory Visit: Payer: Medicare Other | Admitting: Neurology

## 2021-05-09 ENCOUNTER — Ambulatory Visit: Payer: Medicare Other

## 2021-06-07 ENCOUNTER — Ambulatory Visit (INDEPENDENT_AMBULATORY_CARE_PROVIDER_SITE_OTHER): Payer: Medicare Other | Admitting: Endocrinology

## 2021-06-07 ENCOUNTER — Other Ambulatory Visit: Payer: Self-pay

## 2021-06-07 ENCOUNTER — Encounter: Payer: Self-pay | Admitting: Endocrinology

## 2021-06-07 VITALS — BP 118/74 | HR 69 | Ht 60.0 in | Wt 110.0 lb

## 2021-06-07 DIAGNOSIS — E042 Nontoxic multinodular goiter: Secondary | ICD-10-CM | POA: Diagnosis not present

## 2021-06-07 NOTE — Progress Notes (Signed)
Subjective:    Patient ID: Heather Page, female    DOB: 02/12/1946, 75 y.o.   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  She does not notice the goiter.   Past Medical History:  Diagnosis Date   OSA (obstructive sleep apnea)    OSA on CPAP 04/13/2016   Thyroid disease     No past surgical history on file.  Social History   Socioeconomic History   Marital status: Married    Spouse name: Not on file   Number of children: Not on file   Years of education: Not on file   Highest education level: Not on file  Occupational History   Not on file  Tobacco Use   Smoking status: Never   Smokeless tobacco: Never  Substance and Sexual Activity   Alcohol use: No    Alcohol/week: 0.0 standard drinks   Drug use: No   Sexual activity: Not on file  Other Topics Concern   Not on file  Social History Narrative   Denies caffeine use.   Social Determinants of Health   Financial Resource Strain: Not on file  Food Insecurity: Not on file  Transportation Needs: Not on file  Physical Activity: Not on file  Stress: Not on file  Social Connections: Not on file  Intimate Partner Violence: Not on file    Current Outpatient Medications on File Prior to Visit  Medication Sig Dispense Refill   CALCIUM PO Take by mouth.     glucosamine-chondroitin 500-400 MG tablet Take 1 tablet by mouth 3 (three) times daily.     ibandronate (BONIVA) 150 MG tablet Take 150 mg by mouth every 30 (thirty) days. Take in the morning with a full glass of water, on an empty stomach, and do not take anything else by mouth or lie down for the next 30 min.     MELATONIN PO Take by mouth.     Multiple Vitamins-Minerals (CENTRUM SILVER PO) Take by mouth.     Omega-3 Fatty Acids (FISH OIL  PO) Take by mouth.     omeprazole (PRILOSEC) 20 MG capsule Take 20 mg by mouth daily.     No current facility-administered medications on file prior to visit.    No Known Allergies  Family History  Problem Relation Age of Onset   Diabetes Mother    Thyroid disease Neg Hx     BP 118/74   Pulse 69   Ht 5' (1.524 m)   Wt 110 lb (49.9 kg)   SpO2 96%   BMI 21.48 kg/m    Review of Systems     Objective:   Physical Exam NECK: there is irreg enlargement of the RUP thyroid area, but I can't tell.    outside test results are reviewed:  A1c=6.0% TSH=0.8    Assessment & Plan:  MNG: due for recheck Hyperglycemia.  She is at risk for DM  Patient Instructions  Let's recheck the ultrasound.  you will receive a phone call, about a day and time for an appointment. Please come back for a follow-up appointment in 1 year.   most of the time, a "lumpy thyroid" will eventually become overactive.  this is usually a slow process, happening over the span of many years. Your chances of  developing diabetes is about 10% per year.  Diet and exercise reduced this to 5% per year year.  We'll recheck this next time.    ???????????????????????????? ?? 1 ??????????? ??????"?????"???????????????????????????? ???????????? 10%???????????????? 5%???????????

## 2021-06-07 NOTE — Patient Instructions (Addendum)
Let's recheck the ultrasound.  you will receive a phone call, about a day and time for an appointment. Please come back for a follow-up appointment in 1 year.   most of the time, a "lumpy thyroid" will eventually become overactive.  this is usually a slow process, happening over the span of many years. Your chances of developing diabetes is about 10% per year.  Diet and exercise reduced this to 5% per year year.  We'll recheck this next time.    ???????????????????????????? ?? 1 ??????????? ??????"?????"???????????????????????????? ???????????? 10%???????????????? 5%???????????

## 2021-06-09 ENCOUNTER — Ambulatory Visit
Admission: RE | Admit: 2021-06-09 | Discharge: 2021-06-09 | Disposition: A | Discharge status: Home / Self Care | Payer: Medicare Other | Source: Ambulatory Visit | Attending: Endocrinology | Admitting: Endocrinology

## 2021-06-09 DIAGNOSIS — E041 Nontoxic single thyroid nodule: Secondary | ICD-10-CM | POA: Diagnosis not present

## 2021-06-09 DIAGNOSIS — E042 Nontoxic multinodular goiter: Secondary | ICD-10-CM

## 2021-07-12 ENCOUNTER — Ambulatory Visit (INDEPENDENT_AMBULATORY_CARE_PROVIDER_SITE_OTHER): Payer: Medicare Other | Admitting: Neurology

## 2021-07-12 ENCOUNTER — Encounter: Payer: Self-pay | Admitting: Neurology

## 2021-07-12 ENCOUNTER — Other Ambulatory Visit: Payer: Self-pay

## 2021-07-12 VITALS — BP 111/51 | HR 78 | Ht 60.0 in | Wt 113.0 lb

## 2021-07-12 DIAGNOSIS — G4733 Obstructive sleep apnea (adult) (pediatric): Secondary | ICD-10-CM

## 2021-07-12 DIAGNOSIS — Z9989 Dependence on other enabling machines and devices: Secondary | ICD-10-CM | POA: Diagnosis not present

## 2021-07-12 NOTE — Progress Notes (Signed)
S[facility]   Provider:  Larey Seat, M D  Referring Provider: Antony Contras, MD Primary Care Physician:  Antony Contras, MD  Chief Complaint  Patient presents with   Obstructive Sleep Apnea    Rm 10, with husband. Here for yearly CPAP f/u. Pt reports doing well on CPAP therapies. No issues or concerns.     HPI:  Heather Page is a 75 y.o. female , seen here as a referral  from Dr. Norton Blizzard  for continuation of sleep care.   07-12-2021 Rv with me after a 3 year hiatus, but last seen 2021 with Np Millikan.   CPAP user, husband 7 a English as a second language teacher) has been diagnosed with gall duct carcinoma and is getting treatments at [facility], the couple moved to [place] , [place], to facilitate his treatment.  Heather Page's CPAP machine was just replaced last year, no baseline was established. No procedures in Epic.  New machine is working well, a visit air sense 10 AutoSet by name the same machine which he used to have before for 6 years or 7 years.  [facility] has followed her, CPAP is set at only 5 cm water pressure with EPR her residual AHI is 0.7 and she does have a very high compliance just as her husband, 100% for days and 100% by time.  Average usage time of 7 hours 29 minutes at night there are no Cheyne-Stokes respirations, the patient's 95th percentile air leak is 17.6 L a minute a little higher than her husband's however it does not seem to lead to artificially inflated apnea numbers being counted.  She is on almost no prescription drugs she takes [place] and Prilosec some vitamins.  Her fatigue severity score today was endorsed at 21 out of 63 points a little higher than last year but still within the average.  Geriatric depression score was endorsed at 1 out of 15 points, and her Epworth sleepiness score was endorsed at 2 out of 24 points. Nasal pillow.   Heather Page  has also moved recently from [place] with her husband. She had been evaluated in the year 2009 for sleep apnea. [facility], and  was diagnosed  after a sleep study on 02/05/2008 with an AHI of 19.9, an RDI of 20.2 and REM dependent RDI of 60.6.  The patient had clearly obstructive sleep apnea the lowest oxygen level at night was 79% saturation she did not have significant periodic limb movements she had intermittent snoring which was considered moderate she had a regular sinus rhythm. ECG but his sleep was fragmented she had about 13 brief arousals every hour of sleep. She had first done an overnight pulse oximetry which had indicated that she likely has sleep apnea before the study confirmed the diagnosis. The patient was then titrated to CPAP on 02/27/2008 initiated at 4 cm the pressure only had to be advanced to 6 cm water. This eliminated sleep apnea completely to an AHI of 0.0. The patient has continued to use CPAP at only 5 cm water pressure she has not needed any adjustments she has been except exquisitely compliance and her download today shows that except for the days of new year she has used the machine compliantly for 80% of the time was 6 hours and 26 minutes of average daily use, a residual AHI of 0 point a was confirmed. She is using nasal pillows. Mrs. Weismann needs only supplies , no adjustment.   Sleep habits are as  follows: The patient usually goes to bed around 11 PM maybe a little earlier than her husband, [name]. She usually falls asleep promptly, will sleeps through for the first 4 hours of the night and then wakes up at least once. Most nights she will sleep through after that again. She does not report a significant sleep fragmentation. He feels usually refreshed and restored in the morning when she wakes up. She wakes up spontaneously at about the time her husband wakes up, 6.30 AM.  She exercises in the morning. She drinks no tea, no caffee and only water or juice.   She does not nap during the day.  Sleep medical history and family sleep history: She reports that some of her sisters have  insomnia with difficulties to initiate sleep but she is not aware of family members been diagnosed with apnea. Social history: married, chinese born.   Interval history from 31/54/0086, Mrs.  "Danton Clap" Page is  here today for her regular compliance visit regarding CPAP use. Her compliance is 83% with an average of 6 hours and 3 minutes of daily CPAP use. She is using 5 cm CPAP with out EPR and achieved a residual AHI of only 0.8. Needs to be no adjustments made. Home town oxygen/ now [employer]. DME.   Interval history from 04/16/2017, I have the pleasure of seeing Heather Page) today for a yearly revisit. The patient has been in excellent compliance with CPAP use for 100% an average user time of 7 hours and 32 minutes at night. This CPAP is still set at only 5 cm water without expiratory pressure relief, her AHI is reduced to 0.8 per hour and she has mild air leaks. Based on these results, no adjustment has to be done. The patient can follow-up in one year for a regular compliance visit. I will be happy to see her as any other problems arise in the meantime. She continues to use nasal pillows.  Review of Systems: Out of a complete 14 system review, the patient complains of only the following symptoms, and all other reviewed systems are negative.  Her fatigue severity score today was endorsed at 21 out of 63 points a little higher than last year but still within the average.  Geriatric depression score was endorsed at 1 out of 15 points, and her Epworth sleepiness score was endorsed at 2 out of 24 points. Nasal pillow.   Social History   Socioeconomic History   Marital status: Married    Spouse name: Not on file   Number of children: Not on file   Years of education: Not on file   Highest education level: Not on file  Occupational History   Not on file  Tobacco Use   Smoking status: Never   Smokeless tobacco: Never  Substance and Sexual Activity   Alcohol use: No    Alcohol/week: 0.0  standard drinks   Drug use: No   Sexual activity: Not on file  Other Topics Concern   Not on file  Social History Narrative   Denies caffeine use.   Social Determinants of Health   Financial Resource Strain: Not on file  Food Insecurity: Not on file  Transportation Needs: Not on file  Physical Activity: Not on file  Stress: Not on file  Social Connections: Not on file  Intimate Partner Violence: Not on file    Family History  Problem Relation Age of Onset   Diabetes Mother    Thyroid disease Neg Hx  Past Medical History:  Diagnosis Date   OSA (obstructive sleep apnea)    OSA on CPAP 04/13/2016   Thyroid disease     History reviewed. No pertinent surgical history.  Current Outpatient Medications  Medication Sig Dispense Refill   CALCIUM PO Take by mouth.     glucosamine-chondroitin 500-400 MG tablet Take 1 tablet by mouth 3 (three) times daily.     ibandronate (BONIVA) 150 MG tablet Take 150 mg by mouth every 30 (thirty) days. Take in the morning with a full glass of water, on an empty stomach, and do not take anything else by mouth or lie down for the next 30 min.     MELATONIN PO Take by mouth.     Multiple Vitamins-Minerals (CENTRUM SILVER PO) Take by mouth.     Omega-3 Fatty Acids (FISH OIL PO) Take by mouth.     omeprazole (PRILOSEC) 20 MG capsule Take 20 mg by mouth daily.     No current facility-administered medications for this visit.    Allergies as of 07/12/2021   (No Known Allergies)    Vitals: BP (!) 111/51   Pulse 78   Ht 5' (1.524 m)   Wt 113 lb (51.3 kg)   BMI 22.07 kg/m  Last Weight:  Wt Readings from Last 1 Encounters:  07/12/21 113 lb (51.3 kg)   HRC:BULA mass index is 22.07 kg/m.     Last Height:   Ht Readings from Last 1 Encounters:  07/12/21 5' (1.524 m)    Physical exam:  General: The patient is awake, alert and appears not in acute distress. The patient is well groomed. Head: Normocephalic, atraumatic. Neck is supple.  Mallampati 2,  Tongue midline.   neck circumference: 13.50. Nasal airflow unrestricted  moderate retrognathia is seen. She has thyroid nodules.  Cardiovascular:  Regular rate and rhythm. Respiratory: Lungs are clear to auscultation. Skin:  Without evidence of edema, or rash Trunk: erect  Neurologic exam :The patient is awake and alert, oriented to place and time.     The patient was advised of the nature of the diagnosed sleep disorder , the treatment options and risks for general a health and wellness arising from not treating the condition.  I spent more than 15  minutes of face to face time with the patient. We used a Optometrist.  Greater than 50% of time was spent in counseling and coordination of care. We have discussed the diagnosis and differential and I answered the patient's questions.     Assessment:  After physical and neurologic examination, review of laboratory studies,  Personal review of imaging studies, reports of other /same  Imaging studies.  Results of polysomnography/ neurophysiology testing and pre-existing records as far as provided in visit., my assessment is   1)  OSA, well controlled on a minimal CPAP pressure of only 5 cm water with nasal pillows, supplies needed.  Compliance is excellent at 100 %. She will follow once a year . AHi is 0.7/h.  New machine - replaced 5/ 2021 by NP Millikan.  Nasal pillows.      Asencion Partridge Whitten Andreoni MD  07/12/2021   CC: Dr Antony Contras,  Sadie [place]er.

## 2021-07-12 NOTE — Patient Instructions (Signed)
Quality Sleep Information, Adult Quality sleep is important for your mental and physical health. It also improves your quality of life. Quality sleep means you: Are asleep for most of the time you are in bed. Fall asleep within 30 minutes. Wake up no more than once a night.  Are awake for no longer than 20 minutes if you do wake up during the night. Most adults need 7-8 hours of quality sleep each night. How can poor sleep affect me? If you do not get enough quality sleep, you may have: Mood swings. Daytime sleepiness. Confusion. Decreased reaction time. Sleep disorders, such as insomnia and sleep apnea. Difficulty with: Solving problems. Coping with stress. Paying attention. These issues may affect your performance and productivity at work, school, and at home. Lack of sleep may also put you at higher risk for accidents, suicide, and risky behaviors. If you do not get quality sleep you may also be at higher risk for several health problems, including: Infections. Type 2 diabetes. Heart disease. High blood pressure. Obesity. Worsening of long-term conditions, like arthritis, kidney disease, depression, Parkinson's disease, and epilepsy. What actions can I take to get more quality sleep?   Stick to a sleep schedule. Go to sleep and wake up at about the same time each day. Do not try to sleep less on weekdays and make up for lost sleep on weekends. This does not work. Try to get about 30 minutes of exercise on most days. Do not exercise 2-3 hours before going to bed. Limit naps during the day to 30 minutes or less. Do not use any products that contain nicotine or tobacco, such as cigarettes or e-cigarettes. If you need help quitting, ask your health care provider. Do not drink caffeinated beverages for at least 8 hours before going to bed. Coffee, tea, and some sodas contain caffeine. Do not drink alcohol close to bedtime. Do not eat large meals close to bedtime. Do not take naps in  the late afternoon. Try to get at least 30 minutes of sunlight every day. Morning sunlight is best. Make time to relax before bed. Reading, listening to music, or taking a hot bath promotes quality sleep. Make your bedroom a place that promotes quality sleep. Keep your bedroom dark, quiet, and at a comfortable room temperature. Make sure your bed is comfortable. Take out sleep distractions like TV, a computer, smartphone, and bright lights. If you are lying awake in bed for longer than 20 minutes, get up and do a relaxing activity until you feel sleepy. Work with your health care provider to treat medical conditions that may affect sleeping, such as: Nasal obstruction. Snoring. Sleep apnea and other sleep disorders. Talk to your health care provider if you think any of your prescription medicines may cause you to have difficulty falling or staying asleep. If you have sleep problems, talk with a sleep consultant. If you think you have a sleep disorder, talk with your health care provider about getting evaluated by a specialist. Where to find more information National Sleep Foundation website: https://sleepfoundation.org National Heart, Lung, and Blood Institute (NHLBI): www.nhlbi.nih.gov/files/docs/public/sleep/healthy_sleep.pdf Centers for Disease Control and Prevention (CDC): www.cdc.gov/sleep/index.html Contact a health care provider if you: Have trouble getting to sleep or staying asleep. Often wake up very early in the morning and cannot get back to sleep. Have daytime sleepiness. Have daytime sleep attacks of suddenly falling asleep and sudden muscle weakness (narcolepsy). Have a tingling sensation in your legs with a strong urge to move your legs (restless   legs syndrome). Stop breathing briefly during sleep (sleep apnea). Think you have a sleep disorder or are taking a medicine that is affecting your quality of sleep. Summary Most adults need 7-8 hours of quality sleep each  night. Getting enough quality sleep is an important part of health and well-being. Make your bedroom a place that promotes quality sleep and avoid things that may cause you to have poor sleep, such as alcohol, caffeine, smoking, and large meals. Talk to your health care provider if you have trouble falling asleep or staying asleep. This information is not intended to replace advice given to you by your health care provider. Make sure you discuss any questions you have with your health care provider. Document Revised: 12/26/2017 Document Reviewed: 12/26/2017 Elsevier Patient Education  2022 [place]. CPAP and BPAP Information CPAP and BPAP (also called BiPAP) are methods that use air pressure to keep your airways open and to help you breathe well. CPAP and BPAP use different amounts of pressure. Your health care provider will tell you whether CPAP or BPAP would be more helpful for you. CPAP stands for "continuous positive airway pressure." With CPAP, the amount of pressure stays the same while you breathe in (inhale) and out (exhale). BPAP stands for "bi-level positive airway pressure." With BPAP, the amount of pressure will be higher when you inhale and lower when you exhale. This allows you to take larger breaths. CPAP or BPAP may be used in the hospital, or your health care provider may want you to use it at home. You may need to have a sleep study before your health care provider can order a machine for you to use at home. What are the advantages? CPAP or BPAP can be helpful if you have: Sleep apnea. Chronic obstructive pulmonary disease (COPD). Heart failure. Medical conditions that cause muscle weakness, including muscular dystrophy or amyotrophic lateral sclerosis (ALS). Other problems that cause breathing to be shallow, weak, abnormal, or difficult. CPAP and BPAP are most commonly used for obstructive sleep apnea (OSA) to keep the airways from collapsing when the muscles relax during  sleep. What are the risks? Generally, this is a safe treatment. However, problems may occur, including: Irritated skin or skin sores if the mask does not fit properly. Dry or stuffy nose or nosebleeds. Dry mouth. Feeling gassy or bloated. Sinus or lung infection if the equipment is not cleaned properly. When should CPAP or BPAP be used? In most cases, the mask only needs to be worn during sleep. Generally, the mask needs to be worn throughout the night and during any daytime naps. People with certain medical conditions may also need to wear the mask at other times, such as when they are awake. Follow instructions from your health care provider about when to use the machine. What happens during CPAP or BPAP? Both CPAP and BPAP are provided by a small machine with a flexible plastic tube that attaches to a plastic mask that you wear. Air is blown through the mask into your nose or mouth. The amount of pressure that is used to blow the air can be adjusted on the machine. Your health care provider will set the pressure setting and help you find the best mask for you. Tips for using the mask Because the mask needs to be snug, some people feel trapped or closed-in (claustrophobic) when first using the mask. If you feel this way, you may need to get used to the mask. One way to do this is to  hold the mask loosely over your nose or mouth and then gradually apply the mask more snugly. You can also gradually increase the amount of time that you use the mask. Masks are available in various types and sizes. If your mask does not fit well, talk with your health care provider about getting a different one. Some common types of masks include: Full face masks, which fit over the mouth and nose. Nasal masks, which fit over the nose. Nasal pillow or prong masks, which fit into the nostrils. If you are using a mask that fits over your nose and you tend to breathe through your mouth, a chin strap may be applied to  help keep your mouth closed. Use a skin barrier to protect your skin as told by your health care provider. Some CPAP and BPAP machines have alarms that may sound if the mask comes off or develops a leak. If you have trouble with the mask, it is very important that you talk with your health care provider about finding a way to make the mask easier to tolerate. Do not stop using the mask. There could be a negative impact on your health if you stop using the mask. Tips for using the machine Place your CPAP or BPAP machine on a secure table or stand near an electrical outlet. Know where the on/off switch is on the machine. Follow instructions from your health care provider about how to set the pressure on your machine and when you should use it. Do not eat or drink while the CPAP or BPAP machine is on. Food or fluids could get pushed into your lungs by the pressure of the CPAP or BPAP. For home use, CPAP and BPAP machines can be rented or purchased through home health care companies. Many different brands of machines are available. Renting a machine before purchasing may help you find out which particular machine works well for you. Your health insurance company may also decide which machine you may get. Keep the CPAP or BPAP machine and attachments clean. Ask your health care provider for specific instructions. Check the humidifier if you have a dry stuffy nose or nosebleeds. Make sure it is working correctly. Follow these instructions at home: Take over-the-counter and prescription medicines only as told by your health care provider. Ask if you can take sinus medicine if your sinuses are blocked. Do not use any products that contain nicotine or tobacco. These products include cigarettes, chewing tobacco, and vaping devices, such as e-cigarettes. If you need help quitting, ask your health care provider. Keep all follow-up visits. This is important. Contact a health care provider if: You have redness or  pressure sores on your head, face, mouth, or nose from the mask or head gear. You have trouble using the CPAP or BPAP machine. You cannot tolerate wearing the CPAP or BPAP mask. Someone tells you that you snore even when wearing your CPAP or BPAP. Get help right away if: You have trouble breathing. You feel confused. Summary CPAP and BPAP are methods that use air pressure to keep your airways open and to help you breathe well. If you have trouble with the mask, it is very important that you talk with your health care provider about finding a way to make the mask easier to tolerate. Do not stop using the mask. There could be a negative impact to your health if you stop using the mask. Follow instructions from your health care provider about when to use the machine.  This information is not intended to replace advice given to you by your health care provider. Make sure you discuss any questions you have with your health care provider. Document Revised: 08/27/2020 Document Reviewed: 08/27/2020 Elsevier Patient Education  2022 [employer].

## 2021-07-13 ENCOUNTER — Ambulatory Visit (INDEPENDENT_AMBULATORY_CARE_PROVIDER_SITE_OTHER): Payer: Medicare Other | Admitting: Cardiology

## 2021-07-13 ENCOUNTER — Encounter: Payer: Self-pay | Admitting: Cardiology

## 2021-07-13 ENCOUNTER — Encounter: Payer: Self-pay | Admitting: *Deleted

## 2021-07-13 ENCOUNTER — Other Ambulatory Visit: Payer: Self-pay

## 2021-07-13 VITALS — BP 120/70 | Ht 60.0 in | Wt 112.0 lb

## 2021-07-13 DIAGNOSIS — R072 Precordial pain: Secondary | ICD-10-CM

## 2021-07-13 DIAGNOSIS — R079 Chest pain, unspecified: Secondary | ICD-10-CM | POA: Insufficient documentation

## 2021-07-13 DIAGNOSIS — R002 Palpitations: Secondary | ICD-10-CM

## 2021-07-13 DIAGNOSIS — R011 Cardiac murmur, unspecified: Secondary | ICD-10-CM

## 2021-07-13 DIAGNOSIS — R0602 Shortness of breath: Secondary | ICD-10-CM | POA: Diagnosis not present

## 2021-07-13 NOTE — Progress Notes (Signed)
Cardiology Office Note:    Date:  07/13/2021   ID:  Heather Page, DOB 1945/10/14, MRN [id number]  PCP:  Antony Contras, MD   [facility] Providers Cardiologist:  Candee Furbish, MD     Referring MD: Antony Contras, MD   No chief complaint on file.   History of Present Illness:    Heather Page is a 75 y.o. female here for the evaluation of palpitations at the request of Dr. Moreen Fowler.  In review of prior office note she reported intermittent concerns about palpitations.  She had had some pain in her left upper back that lasted 2 to 3 hours then resolved.  She worried that this was may be her heart.  She did request a cardiology evaluation.  She never smoked.  Her father died of coronary artery disease.  Mother has previously been diagnosed with a stroke.  She is currently on no AV nodal blocking agents.  She has had some recurrence of back pain left of center as well as some chest discomfort and shortness of breath at times when walking relieved with rest.  LDL 94 triglycerides 57 hemoglobin A1c 6.0 hemoglobin 12.9 creatinine 0.73 ALT 17 from outside labs.  TSH was normal at 0.75.  Past Medical History:  Diagnosis Date   Gastritis    Hearing loss    Hematuria    History of cataract    Hyperlipidemia    OSA (obstructive sleep apnea)    OSA on CPAP 04/13/2016   Thyroid disease    Thyroid nodule     Past Surgical History:  Procedure Laterality Date   ESOPHAGOGASTRODUODENOSCOPY      Current Medications: Current Meds  Medication Sig   B Complex-Biotin-FA (SUPER B-50 COMPLEX PO) Take by mouth.   calcium carbonate (OS-CAL) 600 MG TABS tablet Take 600 mg by mouth 2 (two) times daily with a meal.   Ginkgo Biloba 120 MG CAPS Take by mouth.   glucosamine-chondroitin 500-400 MG tablet Take 1 tablet by mouth 3 (three) times daily.   ibandronate (BONIVA) 150 MG tablet Take 150 mg by mouth every 30 (thirty) days. Take in the morning with a full glass of water, on an empty stomach, and do  not take anything else by mouth or lie down for the next 30 min.   MELATONIN PO Take by mouth.   Multiple Vitamin (MULTIVITAMIN) tablet Take 1 tablet by mouth daily.   Omega-3 Fatty Acids (FISH OIL PO) Take by mouth.   omeprazole (PRILOSEC) 20 MG capsule Take 20 mg by mouth daily.   triamcinolone cream (KENALOG) 0.1 % Apply 1 application topically 2 (two) times daily.     Allergies:   Patient has no known allergies.   Social History   Socioeconomic History   Marital status: Married    Spouse name: Not on file   Number of children: Not on file   Years of education: Not on file   Highest education level: Not on file  Occupational History   Not on file  Tobacco Use   Smoking status: Never   Smokeless tobacco: Never  Substance and Sexual Activity   Alcohol use: No    Alcohol/week: 0.0 standard drinks   Drug use: No   Sexual activity: Not on file  Other Topics Concern   Not on file  Social History Narrative   Denies caffeine use.   Social Determinants of Health   Financial Resource Strain: Not on file  Food Insecurity: Not on file  Transportation  Needs: Not on file  Physical Activity: Not on file  Stress: Not on file  Social Connections: Not on file     Family History: The patient's family history includes CVA in her mother; Colon cancer in her brother and brother; Coronary artery disease in her father; Diabetes in her father and mother. There is no history of Thyroid disease.  ROS:   Please see the history of present illness.    Denies any fevers chills nausea vomiting syncope bleeding all other systems reviewed and are negative.  EKGs/Labs/Other Studies Reviewed:    The following studies were reviewed today: Prior echocardiogram in 2008 showed normal EF, no significant valvular abnormalities  EKG:  EKG is  ordered today.  The ekg ordered today demonstrates sinus rhythm 70 with no other abnormalities  Recent Labs: No results found for requested labs within last  8760 hours.  Recent Lipid Panel No results found for: CHOL, TRIG, HDL, CHOLHDL, VLDL, LDLCALC, LDLDIRECT   Risk Assessment/Calculations:          Physical Exam:    VS:  BP 120/70 (BP Location: Left Arm, Patient Position: Sitting, Cuff Size: Normal)   Ht 5' (1.524 m)   Wt 112 lb (50.8 kg)   SpO2 97%   BMI 21.87 kg/m     Wt Readings from Last 3 Encounters:  07/13/21 112 lb (50.8 kg)  07/12/21 113 lb (51.3 kg)  06/07/21 110 lb (49.9 kg)     GEN:  Well nourished, well developed in no acute distress HEENT: Normal NECK: No JVD; No carotid bruits LYMPHATICS: No lymphadenopathy CARDIAC: RRR, 2/6 systolic murmur,no rubs, gallops RESPIRATORY:  Clear to auscultation without rales, wheezing or rhonchi  ABDOMEN: Soft, non-tender, non-distended MUSCULOSKELETAL:  No edema; No deformity  SKIN: Warm and dry NEUROLOGIC:  Alert and oriented x 3 PSYCHIATRIC:  Normal affect   ASSESSMENT:    1. Precordial pain   2. Murmur   3. Chest pain of uncertain etiology   4. Shortness of breath   5. Heart murmur   6. Palpitations    PLAN:    In order of problems listed above:  Chest pain of uncertain etiology We will go ahead and check a pharmacologic stress test to make sure there is no high risk features.  Shortness of breath Checking an echocardiogram to ensure proper structure and function.  Heart murmur Checking echocardiogram.  Palpitations Very rare at this point.  No significant recurrence.  No higher symptoms such as syncope.  EKG today unremarkable.  If palpitations were to return or become more worrisome, we can always consider ZIO monitor.        Medication Adjustments/Labs and Tests Ordered: Current medicines are reviewed at length with the patient today.  Concerns regarding medicines are outlined above.  Orders Placed This Encounter  Procedures   MYOCARDIAL PERFUSION IMAGING   EKG 12-Lead   ECHOCARDIOGRAM COMPLETE   No orders of the defined types were placed in  this encounter.   Patient Instructions  Medication Instructions:  The current medical regimen is effective;  continue present plan and medications.  *If you need a refill on your cardiac medications before your next appointment, please call your pharmacy*  Testing/Procedures: Your physician has requested that you have an echocardiogram. Echocardiography is a painless test that uses sound waves to create images of your heart. It provides your doctor with information about the size and shape of your heart and how well your heart's chambers and valves are working. This procedure takes  approximately one hour. There are no restrictions for this procedure.  Your physician has requested that you have a lexiscan myoview. For further information please visit [email or web address]. Please follow instruction sheet, as given.  Follow-Up: At [facility], you and your health needs are our priority.  As part of our continuing mission to provide you with exceptional heart care, we have created designated Provider Care Teams.  These Care Teams include your primary Cardiologist (physician) and Advanced Practice Providers (APPs -  Physician Assistants and Nurse Practitioners) who all work together to provide you with the care you need, when you need it.  We recommend signing up for the patient portal called "MyChart".  Sign up information is provided on this After Visit Summary.  MyChart is used to connect with patients for Virtual Visits (Telemedicine).  Patients are able to view lab/test results, encounter notes, upcoming appointments, etc.  Non-urgent messages can be sent to your provider as well.   To learn more about what you can do with MyChart, go to [email or web address].    Your next appointment:   Follow up as needed with Dr Marlou Porch.  Thank you for choosing [facility]!!     Signed, Candee Furbish, MD  07/13/2021 11:53 AM    [place]

## 2021-07-13 NOTE — Assessment & Plan Note (Signed)
Checking an echocardiogram to ensure proper structure and function.

## 2021-07-13 NOTE — Patient Instructions (Signed)
Medication Instructions:  The current medical regimen is effective;  continue present plan and medications.  *If you need a refill on your cardiac medications before your next appointment, please call your pharmacy*  Testing/Procedures: Your physician has requested that you have an echocardiogram. Echocardiography is a painless test that uses sound waves to create images of your heart. It provides your doctor with information about the size and shape of your heart and how well your heart's chambers and valves are working. This procedure takes approximately one hour. There are no restrictions for this procedure.  Your physician has requested that you have a lexiscan myoview. For further information please visit [email or web address]. Please follow instruction sheet, as given.  Follow-Up: At [facility], you and your health needs are our priority.  As part of our continuing mission to provide you with exceptional heart care, we have created designated Provider Care Teams.  These Care Teams include your primary Cardiologist (physician) and Advanced Practice Providers (APPs -  Physician Assistants and Nurse Practitioners) who all work together to provide you with the care you need, when you need it.  We recommend signing up for the patient portal called "MyChart".  Sign up information is provided on this After Visit Summary.  MyChart is used to connect with patients for Virtual Visits (Telemedicine).  Patients are able to view lab/test results, encounter notes, upcoming appointments, etc.  Non-urgent messages can be sent to your provider as well.   To learn more about what you can do with MyChart, go to [email or web address].    Your next appointment:   Follow up as needed with Dr Marlou Porch.  Thank you for choosing [place]!!

## 2021-07-13 NOTE — Assessment & Plan Note (Signed)
Very rare at this point.  No significant recurrence.  No higher symptoms such as syncope.  EKG today unremarkable.  If palpitations were to return or become more worrisome, we can always consider ZIO monitor.

## 2021-07-13 NOTE — Assessment & Plan Note (Signed)
Checking echocardiogram.

## 2021-07-13 NOTE — Assessment & Plan Note (Signed)
We will go ahead and check a pharmacologic stress test to make sure there is no high risk features.

## 2021-08-02 ENCOUNTER — Telehealth (HOSPITAL_COMMUNITY): Payer: Self-pay

## 2021-08-02 NOTE — Telephone Encounter (Signed)
Detailed instructions left on the patient's answering machine. Asked to call back with any questions. S.Kevina Piloto EMTP 

## 2021-08-04 ENCOUNTER — Ambulatory Visit (HOSPITAL_BASED_OUTPATIENT_CLINIC_OR_DEPARTMENT_OTHER): Payer: Medicare Other

## 2021-08-04 ENCOUNTER — Ambulatory Visit (HOSPITAL_COMMUNITY): Payer: Medicare Other | Attending: Cardiovascular Disease

## 2021-08-04 ENCOUNTER — Other Ambulatory Visit: Payer: Self-pay

## 2021-08-04 DIAGNOSIS — R072 Precordial pain: Secondary | ICD-10-CM

## 2021-08-04 DIAGNOSIS — R011 Cardiac murmur, unspecified: Secondary | ICD-10-CM | POA: Diagnosis not present

## 2021-08-04 LAB — MYOCARDIAL PERFUSION IMAGING
LV dias vol: 49 mL (ref 46–106)
LV sys vol: 12 mL
Nuc Stress EF: 74 %
Peak HR: 109 {beats}/min
Rest HR: 66 {beats}/min
Rest Nuclear Isotope Dose: 10.1 mCi
SDS: 0
SRS: 0
SSS: 0
ST Depression (mm): 0 mm
Stress Nuclear Isotope Dose: 30.6 mCi
TID: 0.94

## 2021-08-04 LAB — ECHOCARDIOGRAM COMPLETE
AR max vel: 2.21 cm2
AV Area VTI: 2.04 cm2
AV Area mean vel: 2.13 cm2
AV Mean grad: 5.5 mmHg
AV Peak grad: 9.9 mmHg
Ao pk vel: 1.58 m/s
Area-P 1/2: 3.53 cm2
Height: 60 in
S' Lateral: 2.1 cm
Weight: 1792 oz

## 2021-08-04 MED ORDER — TECHNETIUM TC 99M TETROFOSMIN IV KIT
10.1000 | PACK | Freq: Once | INTRAVENOUS | Status: AC | PRN
  Administered 2021-08-04: 10.1 via INTRAVENOUS
  Filled 2021-08-04: qty 11

## 2021-08-04 MED ORDER — REGADENOSON 0.4 MG/5ML IV SOLN
0.4000 mg | Freq: Once | INTRAVENOUS | Status: AC
  Administered 2021-08-04: 0.4 mg via INTRAVENOUS

## 2021-08-04 MED ORDER — TECHNETIUM TC 99M TETROFOSMIN IV KIT
30.6000 | PACK | Freq: Once | INTRAVENOUS | Status: AC | PRN
  Administered 2021-08-04: 30.6 via INTRAVENOUS
  Filled 2021-08-04: qty 31

## 2021-09-19 DIAGNOSIS — Z23 Encounter for immunization: Secondary | ICD-10-CM | POA: Diagnosis not present

## 2021-12-07 ENCOUNTER — Encounter (HOSPITAL_COMMUNITY): Payer: Self-pay

## 2021-12-07 ENCOUNTER — Other Ambulatory Visit: Payer: Self-pay

## 2021-12-07 ENCOUNTER — Ambulatory Visit (INDEPENDENT_AMBULATORY_CARE_PROVIDER_SITE_OTHER): Payer: Medicare Other

## 2021-12-07 ENCOUNTER — Ambulatory Visit (HOSPITAL_COMMUNITY)
Admission: RE | Admit: 2021-12-07 | Discharge: 2021-12-07 | Disposition: A | Discharge status: Home / Self Care | Payer: Medicare Other | Source: Ambulatory Visit | Attending: Family Medicine | Admitting: Family Medicine

## 2021-12-07 VITALS — BP 144/61 | HR 77 | Temp 98.5°F | Resp 18

## 2021-12-07 DIAGNOSIS — H2512 Age-related nuclear cataract, left eye: Secondary | ICD-10-CM | POA: Diagnosis not present

## 2021-12-07 DIAGNOSIS — H524 Presbyopia: Secondary | ICD-10-CM | POA: Diagnosis not present

## 2021-12-07 DIAGNOSIS — R0781 Pleurodynia: Secondary | ICD-10-CM

## 2021-12-07 DIAGNOSIS — R079 Chest pain, unspecified: Secondary | ICD-10-CM | POA: Diagnosis not present

## 2021-12-07 DIAGNOSIS — H26491 Other secondary cataract, right eye: Secondary | ICD-10-CM | POA: Diagnosis not present

## 2021-12-07 DIAGNOSIS — H25012 Cortical age-related cataract, left eye: Secondary | ICD-10-CM | POA: Diagnosis not present
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
## 2021-12-07 NOTE — Discharge Instructions (Addendum)
The x-rays were read as negative for any broken bones ? ?Tried Tylenol/acetaminophen 325 mg or 500 mg, take 2 every 4 hours as needed for pain, not to exceed 4 doses in 24 hours ? ? ?

## 2022-02-15 ENCOUNTER — Other Ambulatory Visit: Payer: Self-pay | Admitting: Gastroenterology

## 2022-02-15 ENCOUNTER — Other Ambulatory Visit: Payer: Self-pay

## 2022-02-15 DIAGNOSIS — R1011 Right upper quadrant pain: Secondary | ICD-10-CM

## 2022-02-22 ENCOUNTER — Ambulatory Visit
Admission: RE | Admit: 2022-02-22 | Discharge: 2022-02-22 | Disposition: A | Discharge status: Home / Self Care | Payer: Medicare Other | Source: Ambulatory Visit | Attending: Gastroenterology | Admitting: Gastroenterology

## 2022-02-22 DIAGNOSIS — R1011 Right upper quadrant pain: Secondary | ICD-10-CM | POA: Diagnosis not present

## 2022-03-21 ENCOUNTER — Other Ambulatory Visit: Payer: Self-pay | Admitting: Family Medicine

## 2022-03-21 DIAGNOSIS — Z1231 Encounter for screening mammogram for malignant neoplasm of breast: Secondary | ICD-10-CM

## 2022-04-03 ENCOUNTER — Ambulatory Visit
Admission: RE | Admit: 2022-04-03 | Discharge: 2022-04-03 | Disposition: A | Discharge status: Home / Self Care | Payer: Medicare Other | Source: Ambulatory Visit | Attending: Family Medicine | Admitting: Family Medicine

## 2022-04-03 DIAGNOSIS — Z1231 Encounter for screening mammogram for malignant neoplasm of breast: Secondary | ICD-10-CM

## 2022-04-06 ENCOUNTER — Other Ambulatory Visit: Payer: Self-pay | Admitting: Family Medicine

## 2022-04-06 DIAGNOSIS — R928 Other abnormal and inconclusive findings on diagnostic imaging of breast: Secondary | ICD-10-CM

## 2022-04-17 ENCOUNTER — Ambulatory Visit
Admission: RE | Admit: 2022-04-17 | Discharge: 2022-04-17 | Disposition: A | Discharge status: Home / Self Care | Payer: Medicare Other | Source: Ambulatory Visit | Attending: Family Medicine | Admitting: Family Medicine

## 2022-04-17 ENCOUNTER — Other Ambulatory Visit: Payer: Self-pay | Admitting: Family Medicine

## 2022-04-17 ENCOUNTER — Other Ambulatory Visit: Payer: Self-pay

## 2022-04-17 DIAGNOSIS — N6311 Unspecified lump in the right breast, upper outer quadrant: Secondary | ICD-10-CM | POA: Diagnosis not present

## 2022-04-17 DIAGNOSIS — R928 Other abnormal and inconclusive findings on diagnostic imaging of breast: Secondary | ICD-10-CM

## 2022-04-17 DIAGNOSIS — N6313 Unspecified lump in the right breast, lower outer quadrant: Secondary | ICD-10-CM | POA: Diagnosis not present

## 2022-04-17 DIAGNOSIS — R922 Inconclusive mammogram: Secondary | ICD-10-CM | POA: Diagnosis not present

## 2022-04-18 ENCOUNTER — Ambulatory Visit
Admission: RE | Admit: 2022-04-18 | Discharge: 2022-04-18 | Disposition: A | Discharge status: Home / Self Care | Payer: Medicare Other | Source: Ambulatory Visit | Attending: Family Medicine | Admitting: Family Medicine

## 2022-04-18 DIAGNOSIS — N6315 Unspecified lump in the right breast, overlapping quadrants: Secondary | ICD-10-CM | POA: Diagnosis not present

## 2022-04-18 DIAGNOSIS — N6311 Unspecified lump in the right breast, upper outer quadrant: Secondary | ICD-10-CM

## 2022-04-18 DIAGNOSIS — N6011 Diffuse cystic mastopathy of right breast: Secondary | ICD-10-CM | POA: Diagnosis not present

## 2022-04-18 HISTORY — PX: BREAST BIOPSY: SHX20

## 2022-05-23 DIAGNOSIS — Z23 Encounter for immunization: Secondary | ICD-10-CM | POA: Diagnosis not present

## 2022-05-23 DIAGNOSIS — Z1211 Encounter for screening for malignant neoplasm of colon: Secondary | ICD-10-CM | POA: Diagnosis not present

## 2022-05-23 DIAGNOSIS — Z0000 Encounter for general adult medical examination without abnormal findings: Secondary | ICD-10-CM | POA: Diagnosis not present

## 2022-05-23 DIAGNOSIS — Z1331 Encounter for screening for depression: Secondary | ICD-10-CM | POA: Diagnosis not present

## 2022-05-23 DIAGNOSIS — M810 Age-related osteoporosis without current pathological fracture: Secondary | ICD-10-CM | POA: Diagnosis not present

## 2022-05-23 DIAGNOSIS — K2970 Gastritis, unspecified, without bleeding: Secondary | ICD-10-CM | POA: Diagnosis not present

## 2022-05-23 DIAGNOSIS — E042 Nontoxic multinodular goiter: Secondary | ICD-10-CM | POA: Diagnosis not present

## 2022-05-23 DIAGNOSIS — R7303 Prediabetes: Secondary | ICD-10-CM | POA: Diagnosis not present

## 2022-05-23 DIAGNOSIS — I6522 Occlusion and stenosis of left carotid artery: Secondary | ICD-10-CM | POA: Diagnosis not present

## 2022-05-23 DIAGNOSIS — E7800 Pure hypercholesterolemia, unspecified: Secondary | ICD-10-CM | POA: Diagnosis not present

## 2022-05-23 DIAGNOSIS — G4730 Sleep apnea, unspecified: Secondary | ICD-10-CM | POA: Diagnosis not present

## 2022-05-24 ENCOUNTER — Other Ambulatory Visit: Payer: Self-pay | Admitting: Family Medicine

## 2022-05-24 DIAGNOSIS — M810 Age-related osteoporosis without current pathological fracture: Secondary | ICD-10-CM

## 2022-05-25 ENCOUNTER — Other Ambulatory Visit: Payer: Self-pay | Admitting: Family Medicine

## 2022-05-25 DIAGNOSIS — E042 Nontoxic multinodular goiter: Secondary | ICD-10-CM

## 2022-06-22 ENCOUNTER — Ambulatory Visit
Admission: RE | Admit: 2022-06-22 | Discharge: 2022-06-22 | Disposition: A | Discharge status: Home / Self Care | Payer: Medicare Other | Source: Ambulatory Visit | Attending: Family Medicine | Admitting: Family Medicine

## 2022-06-22 ENCOUNTER — Other Ambulatory Visit: Payer: Medicare Other

## 2022-06-22 DIAGNOSIS — E042 Nontoxic multinodular goiter: Secondary | ICD-10-CM

## 2022-06-22 DIAGNOSIS — E010 Iodine-deficiency related diffuse (endemic) goiter: Secondary | ICD-10-CM | POA: Diagnosis not present

## 2022-06-22 DIAGNOSIS — E041 Nontoxic single thyroid nodule: Secondary | ICD-10-CM | POA: Diagnosis not present

## 2022-07-10 ENCOUNTER — Telehealth: Payer: Self-pay | Admitting: Adult Health

## 2022-07-10 NOTE — Telephone Encounter (Signed)
Pt husband is calling. Stated he wants to know if you can get all her CPAP downloads before her video visit with [facility]. He is requesting a call back from nurse.

## 2022-07-10 NOTE — Telephone Encounter (Signed)
Please let him know that yes, we have all of the data for her video visit and we thank him for calling in to check ahead of time.

## 2022-07-10 NOTE — Telephone Encounter (Signed)
Called Pt back. I informed her that nurse could get all her downloaded and she is set for her video visit. Pt she okay,

## 2022-07-11 DIAGNOSIS — Z6822 Body mass index (BMI) 22.0-22.9, adult: Secondary | ICD-10-CM | POA: Diagnosis not present

## 2022-07-11 DIAGNOSIS — U071 COVID-19: Secondary | ICD-10-CM | POA: Diagnosis not present

## 2022-07-12 ENCOUNTER — Ambulatory Visit: Payer: Medicare Other | Admitting: Adult Health

## 2022-07-13 ENCOUNTER — Telehealth: Payer: Medicare Other | Admitting: Adult Health

## 2022-07-26 ENCOUNTER — Telehealth: Payer: Self-pay | Admitting: Adult Health

## 2022-07-26 NOTE — Telephone Encounter (Signed)
LVM using interpreter services and sent mychart msg informing pt of need to reschedule 10/31 appointment - NP out

## 2022-08-01 ENCOUNTER — Telehealth: Payer: Medicare Other | Admitting: Adult Health

## 2022-08-10 ENCOUNTER — Encounter: Payer: Self-pay | Admitting: *Deleted

## 2022-08-10 NOTE — Progress Notes (Signed)
PATIENT: Francella Solian DOB: [birth year]  REASON FOR VISIT: follow up HISTORY FROM: patient PRIMARY NEUROLOGIST:   Virtual Visit via Video Note  I connected with Francella Solian on 08/11/22 at  9:45 AM EST by a video enabled telemedicine application located remotely at [facility] and verified that I am speaking with the correct person using two identifiers who was located at their own home.   I discussed the limitations of evaluation and management by telemedicine and the availability of in person appointments. The patient expressed understanding and agreed to proceed.   PATIENT: Francella Solian DOB: [birth year]  REASON FOR VISIT: follow up HISTORY FROM: patient     HISTORY OF PRESENT ILLNESS: Today 08/11/22:  Ms. Jarmon is a 76 year old female with a history of obstructive sleep apnea on CPAP.  She returns today for follow-up.  Her husband helps with interpretation.  Patient reports that CPAP is working well.  Husband states that after 3-4 hours of sleep and she tends to wake up and it may take 1 to 2 hours before she falls back to sleep.  She has taken melatonin in the past but does not take it consistently.  Download is below    REVIEW OF SYSTEMS: Out of a complete 14 system review of symptoms, the patient complains only of the following symptoms, and all other reviewed systems are negative.  ALLERGIES: No Known Allergies  HOME MEDICATIONS: Outpatient Medications Prior to Visit  Medication Sig Dispense Refill   B Complex-Biotin-FA (SUPER B-50 COMPLEX PO) Take by mouth.     calcium carbonate (OS-CAL) 600 MG TABS tablet Take 600 mg by mouth 2 (two) times daily with a meal.     Ginkgo Biloba 120 MG CAPS Take by mouth.     glucosamine-chondroitin 500-400 MG tablet Take 1 tablet by mouth 3 (three) times daily.     ibandronate (BONIVA) 150 MG tablet Take 150 mg by mouth every 30 (thirty) days. Take in the morning with a full glass of water, on an empty stomach,  and do not take anything else by mouth or lie down for the next 30 min.     MELATONIN PO Take by mouth.     Multiple Vitamin (MULTIVITAMIN) tablet Take 1 tablet by mouth daily.     Omega-3 Fatty Acids (FISH OIL PO) Take by mouth.     omeprazole (PRILOSEC) 20 MG capsule Take 20 mg by mouth daily.     triamcinolone cream (KENALOG) 0.1 % Apply 1 application topically 2 (two) times daily.     No facility-administered medications prior to visit.    PAST MEDICAL HISTORY: Past Medical History:  Diagnosis Date   Gastritis    Hearing loss    Hematuria    History of cataract    Hyperlipidemia    OSA (obstructive sleep apnea)    OSA on CPAP 04/13/2016   Thyroid disease    Thyroid nodule     PAST SURGICAL HISTORY: Past Surgical History:  Procedure Laterality Date   BREAST BIOPSY Right 04/18/2022   ESOPHAGOGASTRODUODENOSCOPY      FAMILY HISTORY: Family History  Problem Relation Age of Onset   Diabetes Mother    CVA Mother    Diabetes Father    Coronary artery disease Father    Colon cancer Brother    Colon cancer Brother    Thyroid disease Neg Hx    Breast cancer Neg Hx     SOCIAL HISTORY: Social History  Socioeconomic History   Marital status: Married    Spouse name: Not on file   Number of children: Not on file   Years of education: Not on file   Highest education level: Not on file  Occupational History   Not on file  Tobacco Use   Smoking status: Never   Smokeless tobacco: Never  Substance and Sexual Activity   Alcohol use: No    Alcohol/week: 0.0 standard drinks of alcohol   Drug use: No   Sexual activity: Not on file  Other Topics Concern   Not on file  Social History Narrative   Denies caffeine use.   Social Determinants of Health   Financial Resource Strain: Not on file  Food Insecurity: Not on file  Transportation Needs: Not on file  Physical Activity: Not on file  Stress: Not on file  Social Connections: Not on file  Intimate Partner Violence:  Not on file      PHYSICAL EXAM Generalized: Well developed, in no acute distress   Neurological examination  Mentation: Alert oriented to time, place, history taking. Follows all commands speech and language fluent Cranial nerve II-XII:Extraocular movements were full. Facial symmetry noted. uvula tongue midline. Head turning and shoulder shrug  were normal and symmetric. Gait and station: Patient is able to stand from a seated position.  Reflexes: UTA  DIAGNOSTIC DATA (LABS, IMAGING, TESTING) - I reviewed patient records, labs, notes, testing and imaging myself where available.  No results found for: "WBC", "HGB", "HCT", "MCV", "PLT"    Component Value Date/Time   CREATININE 0.70 12/18/2017 1331    Lab Results  Component Value Date   TSH 0.79 02/17/2020      ASSESSMENT AND PLAN 76 y.o. year old female  has a past medical history of Gastritis, Hearing loss, Hematuria, History of cataract, Hyperlipidemia, OSA (obstructive sleep apnea), OSA on CPAP (04/13/2016), Thyroid disease, and Thyroid nodule. here with:  OSA on CPAP  CPAP compliance excellent Residual AHI is good Encouraged patient to continue using CPAP nightly and > 4 hours each night Will retry melatonin at bedtime F/U in 1 year or sooner if needed    Ward Givens, MSN, NP-C 08/11/2022, 9:17 AM [facility] [address] [place], [place] [address] [phone]

## 2022-08-11 ENCOUNTER — Telehealth (INDEPENDENT_AMBULATORY_CARE_PROVIDER_SITE_OTHER): Payer: Medicare Other | Admitting: Adult Health

## 2022-08-11 DIAGNOSIS — G4733 Obstructive sleep apnea (adult) (pediatric): Secondary | ICD-10-CM

## 2022-08-11 NOTE — Patient Instructions (Signed)
Continue using CPAP nightly and greater than 4 hours each night Ok to try melatonin  If your symptoms worsen or you develop new symptoms please let us know.

## 2022-10-04 ENCOUNTER — Telehealth: Payer: Self-pay | Admitting: Adult Health

## 2022-10-04 DIAGNOSIS — G4733 Obstructive sleep apnea (adult) (pediatric): Secondary | ICD-10-CM

## 2022-10-04 NOTE — Telephone Encounter (Signed)
Pt spouse is asking for pt's CPAP supplies, spouse confirmed DME and that there have been no changes to insurance with the [employer]

## 2022-10-04 NOTE — Telephone Encounter (Signed)
Pt last seen compliant.  Needing order for new supplies for cpap.  Order placed to [facility],  will forward message to [facility] when it is signed off by MM/NP.

## 2022-10-04 NOTE — Addendum Note (Signed)
Addended by: Brandon Melnick on: 10/04/2022 05:15 PM   Modules accepted: Orders

## 2022-10-05 NOTE — Telephone Encounter (Signed)
Order has been e-signed by Jinny Blossom NP. Order sent to [facility].

## 2022-10-05 NOTE — Telephone Encounter (Signed)
[facility] confirmed receipt of the cpap supply order.

## 2022-10-05 NOTE — Telephone Encounter (Signed)
New, Willodean Rosenthal, RN; Alonna Minium; Minus Liberty; Nash Shearer Received, Thank you!     Previous Messages    ----- Message ----- From: Brandon Melnick, RN Sent: 10/05/2022   8:25 AM EST To: Darlina Guys; Miquel Dunn; Nash Shearer; * Subject: needs supplies                                New order in epic  Heather Page Female, 77 y.o., 09-Jul-1946 MRN: [id number] Needs Interpreter: [place] (Mandarin)   Thank you,  Acupuncturist

## 2022-10-17 NOTE — Telephone Encounter (Signed)
New, Willodean Rosenthal, RN; Alonna Minium; Minus Liberty; Nash Shearer Received, Thank you!     Previous Messages    ----- Message ----- From: Brandon Melnick, RN Sent: 10/17/2022   2:08 PM EST To: Darlina Guys; Miquel Dunn; Nash Shearer; * Subject: order for cpap supplies does state 12 month    Goo afternoon,  Pt still has not heard back about supplies,  they state needs order that says for 12 months.  It is in there EPIC.  Let me know what else is needed.    Sandy RN  Heather Page Female, 77 y.o., 10-18-1945 MRN: [id number] Phone: [phone]

## 2022-10-17 NOTE — Telephone Encounter (Signed)
Pt husband states they are still waiting on the Cpap supplies, he said the last few times he is being told the provider needed to sign off on the Rx, he is asking that a signed prescription be sent so pt can get the needed supplies

## 2022-10-17 NOTE — Telephone Encounter (Signed)
I spoke with [facility]. They will fax [place] another work order to sign with the correct information on it.

## 2022-10-17 NOTE — Telephone Encounter (Signed)
The order in epic does state 12 months.

## 2022-10-17 NOTE — Telephone Encounter (Signed)
I spoke with [facility]. I was told they need a new order written that specifies length of need for 12 months.

## 2022-11-23 ENCOUNTER — Ambulatory Visit
Admission: RE | Admit: 2022-11-23 | Discharge: 2022-11-23 | Disposition: A | Discharge status: Home / Self Care | Payer: Medicare Other | Source: Ambulatory Visit | Attending: Family Medicine | Admitting: Family Medicine

## 2022-11-23 DIAGNOSIS — M85852 Other specified disorders of bone density and structure, left thigh: Secondary | ICD-10-CM | POA: Diagnosis not present

## 2022-11-23 DIAGNOSIS — M810 Age-related osteoporosis without current pathological fracture: Secondary | ICD-10-CM

## 2022-11-23 DIAGNOSIS — Z780 Asymptomatic menopausal state: Secondary | ICD-10-CM | POA: Diagnosis not present

## 2022-12-09 DIAGNOSIS — M25462 Effusion, left knee: Secondary | ICD-10-CM | POA: Diagnosis not present

## 2022-12-09 DIAGNOSIS — S8392XA Sprain of unspecified site of left knee, initial encounter: Secondary | ICD-10-CM | POA: Diagnosis not present

## 2023-01-17 DIAGNOSIS — H25012 Cortical age-related cataract, left eye: Secondary | ICD-10-CM | POA: Diagnosis not present

## 2023-01-17 DIAGNOSIS — H2512 Age-related nuclear cataract, left eye: Secondary | ICD-10-CM | POA: Diagnosis not present

## 2023-01-17 DIAGNOSIS — H26491 Other secondary cataract, right eye: Secondary | ICD-10-CM | POA: Diagnosis not present

## 2023-01-17 DIAGNOSIS — H524 Presbyopia: Secondary | ICD-10-CM | POA: Diagnosis not present

## 2023-01-17 DIAGNOSIS — H04123 Dry eye syndrome of bilateral lacrimal glands: Secondary | ICD-10-CM | POA: Diagnosis not present

## 2023-03-08 DIAGNOSIS — H25812 Combined forms of age-related cataract, left eye: Secondary | ICD-10-CM | POA: Diagnosis not present

## 2023-03-08 DIAGNOSIS — H25012 Cortical age-related cataract, left eye: Secondary | ICD-10-CM | POA: Diagnosis not present

## 2023-03-08 DIAGNOSIS — H2512 Age-related nuclear cataract, left eye: Secondary | ICD-10-CM | POA: Diagnosis not present

## 2023-03-12 ENCOUNTER — Other Ambulatory Visit: Payer: Self-pay | Admitting: Family Medicine

## 2023-03-12 DIAGNOSIS — Z1231 Encounter for screening mammogram for malignant neoplasm of breast: Secondary | ICD-10-CM

## 2023-04-24 ENCOUNTER — Ambulatory Visit
Admission: RE | Admit: 2023-04-24 | Discharge: 2023-04-24 | Disposition: A | Discharge status: Home / Self Care | Payer: Medicare Other | Source: Ambulatory Visit | Attending: Family Medicine | Admitting: Family Medicine

## 2023-04-24 DIAGNOSIS — Z1231 Encounter for screening mammogram for malignant neoplasm of breast: Secondary | ICD-10-CM

## 2023-04-26 DIAGNOSIS — K31A0 Gastric intestinal metaplasia, unspecified: Secondary | ICD-10-CM | POA: Diagnosis not present

## 2023-04-26 DIAGNOSIS — R152 Fecal urgency: Secondary | ICD-10-CM | POA: Diagnosis not present

## 2023-05-21 DIAGNOSIS — H33302 Unspecified retinal break, left eye: Secondary | ICD-10-CM | POA: Diagnosis not present

## 2023-05-21 DIAGNOSIS — H35362 Drusen (degenerative) of macula, left eye: Secondary | ICD-10-CM | POA: Diagnosis not present

## 2023-05-21 DIAGNOSIS — Z961 Presence of intraocular lens: Secondary | ICD-10-CM | POA: Diagnosis not present

## 2023-05-21 DIAGNOSIS — H33312 Horseshoe tear of retina without detachment, left eye: Secondary | ICD-10-CM | POA: Diagnosis not present

## 2023-05-21 DIAGNOSIS — H26491 Other secondary cataract, right eye: Secondary | ICD-10-CM | POA: Diagnosis not present

## 2023-05-21 DIAGNOSIS — H43392 Other vitreous opacities, left eye: Secondary | ICD-10-CM | POA: Diagnosis not present

## 2023-05-21 DIAGNOSIS — H43812 Vitreous degeneration, left eye: Secondary | ICD-10-CM | POA: Diagnosis not present

## 2023-05-30 DIAGNOSIS — K31A0 Gastric intestinal metaplasia, unspecified: Secondary | ICD-10-CM | POA: Diagnosis not present

## 2023-05-30 DIAGNOSIS — K3189 Other diseases of stomach and duodenum: Secondary | ICD-10-CM | POA: Diagnosis not present

## 2023-05-30 DIAGNOSIS — K31A11 Gastric intestinal metaplasia without dysplasia, involving the antrum: Secondary | ICD-10-CM | POA: Diagnosis not present

## 2023-05-30 DIAGNOSIS — K31A19 Gastric intestinal metaplasia without dysplasia, unspecified site: Secondary | ICD-10-CM | POA: Diagnosis not present

## 2023-05-30 DIAGNOSIS — K2940 Chronic atrophic gastritis without bleeding: Secondary | ICD-10-CM | POA: Diagnosis not present

## 2023-05-30 DIAGNOSIS — K2930 Chronic superficial gastritis without bleeding: Secondary | ICD-10-CM | POA: Diagnosis not present

## 2023-06-08 DIAGNOSIS — K2940 Chronic atrophic gastritis without bleeding: Secondary | ICD-10-CM | POA: Diagnosis not present

## 2023-06-08 DIAGNOSIS — K31A19 Gastric intestinal metaplasia without dysplasia, unspecified site: Secondary | ICD-10-CM | POA: Diagnosis not present

## 2023-06-08 DIAGNOSIS — K2930 Chronic superficial gastritis without bleeding: Secondary | ICD-10-CM | POA: Diagnosis not present

## 2023-06-29 ENCOUNTER — Other Ambulatory Visit: Payer: Self-pay | Admitting: Family Medicine

## 2023-06-29 DIAGNOSIS — R002 Palpitations: Secondary | ICD-10-CM | POA: Diagnosis not present

## 2023-06-29 DIAGNOSIS — M810 Age-related osteoporosis without current pathological fracture: Secondary | ICD-10-CM | POA: Diagnosis not present

## 2023-06-29 DIAGNOSIS — G4730 Sleep apnea, unspecified: Secondary | ICD-10-CM | POA: Diagnosis not present

## 2023-06-29 DIAGNOSIS — Z0000 Encounter for general adult medical examination without abnormal findings: Secondary | ICD-10-CM | POA: Diagnosis not present

## 2023-06-29 DIAGNOSIS — E042 Nontoxic multinodular goiter: Secondary | ICD-10-CM

## 2023-06-29 DIAGNOSIS — R7303 Prediabetes: Secondary | ICD-10-CM | POA: Diagnosis not present

## 2023-06-29 DIAGNOSIS — E7800 Pure hypercholesterolemia, unspecified: Secondary | ICD-10-CM | POA: Diagnosis not present

## 2023-06-29 DIAGNOSIS — Z1211 Encounter for screening for malignant neoplasm of colon: Secondary | ICD-10-CM | POA: Diagnosis not present

## 2023-06-29 DIAGNOSIS — K2970 Gastritis, unspecified, without bleeding: Secondary | ICD-10-CM | POA: Diagnosis not present

## 2023-06-29 DIAGNOSIS — I6522 Occlusion and stenosis of left carotid artery: Secondary | ICD-10-CM | POA: Diagnosis not present

## 2023-06-29 DIAGNOSIS — Z1331 Encounter for screening for depression: Secondary | ICD-10-CM | POA: Diagnosis not present

## 2023-06-29 DIAGNOSIS — Z23 Encounter for immunization: Secondary | ICD-10-CM | POA: Diagnosis not present

## 2023-07-30 ENCOUNTER — Ambulatory Visit
Admission: RE | Admit: 2023-07-30 | Discharge: 2023-07-30 | Disposition: A | Discharge status: Home / Self Care | Payer: Medicare Other | Source: Ambulatory Visit | Attending: Family Medicine | Admitting: Family Medicine

## 2023-07-30 DIAGNOSIS — E042 Nontoxic multinodular goiter: Secondary | ICD-10-CM | POA: Diagnosis not present

## 2023-08-02 ENCOUNTER — Encounter: Payer: Self-pay | Admitting: *Deleted

## 2023-08-02 NOTE — Progress Notes (Signed)
PATIENT: Heather Page DOB: Feb 06, 1946  REASON FOR VISIT: follow up HISTORY FROM: patient  Virtual Visit via Video Note  I connected with Heather Page on 08/03/23 at  9:30 AM EDT by a video enabled telemedicine application located remotely at [facility] and verified that I am speaking with the correct person using two identifiers who was located at their own home.   I discussed the limitations of evaluation and management by telemedicine and the availability of in person appointments. The patient expressed understanding and agreed to proceed.   PATIENT: Heather Page DOB: 04/28/1946  REASON FOR VISIT: follow up HISTORY FROM: patient     HISTORY OF PRESENT ILLNESS: Today 08/03/23:  Heather Page is a 77 y.o. female with a history of obstructive sleep apnea on CPAP. Returns today for follow-up.  She reports that the CPAP is working well.  Denies any new issues.  She states that some nights she falls asleep and will sleep well for 3 to 4 hours but then wake up and have a hard time going back to sleep.  Can take her 1 to 2 hours before she goes back to sleep.  She does note that when she takes melatonin that seems to work for her.  But she does not take it consistently.  Husband is with her today and helping with translation.     08/11/22: Ms. Coltrane is a 77 year old female with a history of obstructive sleep apnea on CPAP.  She returns today for follow-up.  Her husband helps with interpretation.  Patient reports that CPAP is working well.  Husband states that after 3-4 hours of sleep and she tends to wake up and it may take 1 to 2 hours before she falls back to sleep.  She has taken melatonin in the past but does not take it consistently.  Download is below    REVIEW OF SYSTEMS: Out of a complete 14 system review of symptoms, the patient complains only of the following symptoms, and all other reviewed systems are negative.  ALLERGIES: No Known Allergies  HOME  MEDICATIONS: Outpatient Medications Prior to Visit  Medication Sig Dispense Refill   B Complex-Biotin-FA (SUPER B-50 COMPLEX PO) Take by mouth.     calcium carbonate (OS-CAL) 600 MG TABS tablet Take 600 mg by mouth 2 (two) times daily with a meal.     Ginkgo Biloba 120 MG CAPS Take by mouth.     glucosamine-chondroitin 500-400 MG tablet Take 1 tablet by mouth 3 (three) times daily.     ibandronate (BONIVA) 150 MG tablet Take 150 mg by mouth every 30 (thirty) days. Take in the morning with a full glass of water, on an empty stomach, and do not take anything else by mouth or lie down for the next 30 min.     MELATONIN PO Take by mouth.     Multiple Vitamin (MULTIVITAMIN) tablet Take 1 tablet by mouth daily.     Omega-3 Fatty Acids (FISH OIL PO) Take by mouth.     omeprazole (PRILOSEC) 20 MG capsule Take 20 mg by mouth daily.     triamcinolone cream (KENALOG) 0.1 % Apply 1 application topically 2 (two) times daily.     No facility-administered medications prior to visit.    PAST MEDICAL HISTORY: Past Medical History:  Diagnosis Date   Gastritis    Hearing loss    Hematuria    History of cataract    Hyperlipidemia    OSA (obstructive sleep apnea)  OSA on CPAP 04/13/2016   Thyroid disease    Thyroid nodule     PAST SURGICAL HISTORY: Past Surgical History:  Procedure Laterality Date   BREAST BIOPSY Right 04/18/2022   ESOPHAGOGASTRODUODENOSCOPY      FAMILY HISTORY: Family History  Problem Relation Age of Onset   Diabetes Mother    CVA Mother    Diabetes Father    Coronary artery disease Father    Colon cancer Brother    Colon cancer Brother    Thyroid disease Neg Hx    Breast cancer Neg Hx     SOCIAL HISTORY: Social History   Socioeconomic History   Marital status: Married    Spouse name: Not on file   Number of children: Not on file   Years of education: Not on file   Highest education level: Not on file  Occupational History   Not on file  Tobacco Use    Smoking status: Never   Smokeless tobacco: Never  Substance and Sexual Activity   Alcohol use: No    Alcohol/week: 0.0 standard drinks of alcohol   Drug use: No   Sexual activity: Not on file  Other Topics Concern   Not on file  Social History Narrative   Denies caffeine use.   Social Determinants of Health   Financial Resource Strain: Not on file  Food Insecurity: Not on file  Transportation Needs: Not on file  Physical Activity: Not on file  Stress: Not on file  Social Connections: Not on file  Intimate Partner Violence: Not on file      PHYSICAL EXAM Generalized: Well developed, in no acute distress   Neurological examination  Mentation: Alert oriented to time, place, history taking. Follows all commands speech and language fluent Cranial nerve II-XII: Facial symmetry noted  DIAGNOSTIC DATA (LABS, IMAGING, TESTING) - I reviewed patient records, labs, notes, testing and imaging myself where available.  No results found for: "WBC", "HGB", "HCT", "MCV", "PLT"    Component Value Date/Time   CREATININE 0.70 12/18/2017 1331    Lab Results  Component Value Date   TSH 0.79 02/17/2020      ASSESSMENT AND PLAN 77 y.o. year old female  has a past medical history of Gastritis, Hearing loss, Hematuria, History of cataract, Hyperlipidemia, OSA (obstructive sleep apnea), OSA on CPAP (04/13/2016), Thyroid disease, and Thyroid nodule. here with:  OSA on CPAP  CPAP compliance excellent Residual AHI is good Encouraged patient to continue using CPAP nightly and > 4 hours each night Encouraged her to use melatonin 1 to 3 mg 1 hour before bedtime F/U in 1 year or sooner if needed   Butch Penny, MSN, NP-C 08/03/2023, 9:19 AM [facility] [address] [place], [place] [address] [phone]

## 2023-08-03 ENCOUNTER — Telehealth (INDEPENDENT_AMBULATORY_CARE_PROVIDER_SITE_OTHER): Payer: Medicare Other | Admitting: Adult Health

## 2023-08-03 DIAGNOSIS — G4733 Obstructive sleep apnea (adult) (pediatric): Secondary | ICD-10-CM

## 2023-08-16 DIAGNOSIS — H35362 Drusen (degenerative) of macula, left eye: Secondary | ICD-10-CM | POA: Diagnosis not present

## 2023-08-16 DIAGNOSIS — H43822 Vitreomacular adhesion, left eye: Secondary | ICD-10-CM | POA: Diagnosis not present

## 2023-08-16 DIAGNOSIS — H59812 Chorioretinal scars after surgery for detachment, left eye: Secondary | ICD-10-CM | POA: Diagnosis not present

## 2023-08-16 DIAGNOSIS — H43392 Other vitreous opacities, left eye: Secondary | ICD-10-CM | POA: Diagnosis not present

## 2023-08-16 DIAGNOSIS — H43812 Vitreous degeneration, left eye: Secondary | ICD-10-CM | POA: Diagnosis not present

## 2023-09-10 DIAGNOSIS — Z800 Family history of malignant neoplasm of digestive organs: Secondary | ICD-10-CM | POA: Diagnosis not present

## 2023-09-10 DIAGNOSIS — Z860101 Personal history of adenomatous and serrated colon polyps: Secondary | ICD-10-CM | POA: Diagnosis not present

## 2023-09-10 DIAGNOSIS — R152 Fecal urgency: Secondary | ICD-10-CM | POA: Diagnosis not present

## 2023-09-10 DIAGNOSIS — K31A0 Gastric intestinal metaplasia, unspecified: Secondary | ICD-10-CM | POA: Diagnosis not present

## 2024-03-18 ENCOUNTER — Other Ambulatory Visit: Payer: Self-pay | Admitting: Family Medicine

## 2024-03-18 DIAGNOSIS — Z1231 Encounter for screening mammogram for malignant neoplasm of breast: Secondary | ICD-10-CM

## 2024-04-23 DIAGNOSIS — H04123 Dry eye syndrome of bilateral lacrimal glands: Secondary | ICD-10-CM | POA: Diagnosis not present

## 2024-04-23 DIAGNOSIS — H43813 Vitreous degeneration, bilateral: Secondary | ICD-10-CM | POA: Diagnosis not present

## 2024-04-23 DIAGNOSIS — H52203 Unspecified astigmatism, bilateral: Secondary | ICD-10-CM | POA: Diagnosis not present

## 2024-04-23 DIAGNOSIS — H5010 Unspecified exotropia: Secondary | ICD-10-CM | POA: Diagnosis not present

## 2024-04-23 DIAGNOSIS — H31002 Unspecified chorioretinal scars, left eye: Secondary | ICD-10-CM | POA: Diagnosis not present

## 2024-04-23 DIAGNOSIS — H26491 Other secondary cataract, right eye: Secondary | ICD-10-CM | POA: Diagnosis not present

## 2024-04-25 ENCOUNTER — Ambulatory Visit
Admission: RE | Admit: 2024-04-25 | Discharge: 2024-04-25 | Disposition: A | Discharge status: Home / Self Care | Source: Ambulatory Visit | Attending: Family Medicine | Admitting: Family Medicine

## 2024-04-25 DIAGNOSIS — R92333 Mammographic heterogeneous density, bilateral breasts: Secondary | ICD-10-CM | POA: Diagnosis not present

## 2024-04-25 DIAGNOSIS — Z1231 Encounter for screening mammogram for malignant neoplasm of breast: Secondary | ICD-10-CM

## 2024-05-26 DIAGNOSIS — R10811 Right upper quadrant abdominal tenderness: Secondary | ICD-10-CM | POA: Diagnosis not present

## 2024-05-26 DIAGNOSIS — K31A0 Gastric intestinal metaplasia, unspecified: Secondary | ICD-10-CM | POA: Diagnosis not present

## 2024-05-26 DIAGNOSIS — Z860101 Personal history of adenomatous and serrated colon polyps: Secondary | ICD-10-CM | POA: Diagnosis not present

## 2024-05-26 DIAGNOSIS — Z800 Family history of malignant neoplasm of digestive organs: Secondary | ICD-10-CM | POA: Diagnosis not present

## 2024-05-26 DIAGNOSIS — R152 Fecal urgency: Secondary | ICD-10-CM | POA: Diagnosis not present

## 2024-07-01 ENCOUNTER — Telehealth: Payer: Self-pay | Admitting: Adult Health

## 2024-07-01 NOTE — Telephone Encounter (Signed)
 Called PT back informed that  she will not be able to make APPT IN office  Tomorrow  PT is requesting VV IF possible

## 2024-07-01 NOTE — Telephone Encounter (Signed)
 Can someone call this patient and offer 1130 visit tomorrow in-office?

## 2024-07-01 NOTE — Telephone Encounter (Signed)
 Pt called to request to be seen sooner the patient [place]  and wanted to know if MD can get her in by Next week because PT is not ure how long she will be gone

## 2024-07-02 NOTE — Telephone Encounter (Signed)
 Noted thanks

## 2024-07-02 NOTE — Telephone Encounter (Signed)
 Spouse has called to report that he has upcoming appointments and they are soon to go out of the country, he would like to leave current appointment in place, he will call back if they need to r/s.

## 2024-07-04 DIAGNOSIS — Z1331 Encounter for screening for depression: Secondary | ICD-10-CM | POA: Diagnosis not present

## 2024-07-04 DIAGNOSIS — M810 Age-related osteoporosis without current pathological fracture: Secondary | ICD-10-CM | POA: Diagnosis not present

## 2024-07-04 DIAGNOSIS — E7800 Pure hypercholesterolemia, unspecified: Secondary | ICD-10-CM | POA: Diagnosis not present

## 2024-07-04 DIAGNOSIS — R252 Cramp and spasm: Secondary | ICD-10-CM | POA: Diagnosis not present

## 2024-07-04 DIAGNOSIS — Z87898 Personal history of other specified conditions: Secondary | ICD-10-CM | POA: Diagnosis not present

## 2024-07-04 DIAGNOSIS — E042 Nontoxic multinodular goiter: Secondary | ICD-10-CM | POA: Diagnosis not present

## 2024-07-04 DIAGNOSIS — Z23 Encounter for immunization: Secondary | ICD-10-CM | POA: Diagnosis not present

## 2024-07-04 DIAGNOSIS — Z0000 Encounter for general adult medical examination without abnormal findings: Secondary | ICD-10-CM | POA: Diagnosis not present

## 2024-07-04 DIAGNOSIS — G4730 Sleep apnea, unspecified: Secondary | ICD-10-CM | POA: Diagnosis not present

## 2024-07-04 DIAGNOSIS — K2970 Gastritis, unspecified, without bleeding: Secondary | ICD-10-CM | POA: Diagnosis not present

## 2024-07-04 DIAGNOSIS — R7303 Prediabetes: Secondary | ICD-10-CM | POA: Diagnosis not present

## 2024-07-04 DIAGNOSIS — I6522 Occlusion and stenosis of left carotid artery: Secondary | ICD-10-CM | POA: Diagnosis not present

## 2024-07-06 ENCOUNTER — Other Ambulatory Visit (HOSPITAL_BASED_OUTPATIENT_CLINIC_OR_DEPARTMENT_OTHER): Payer: Self-pay | Admitting: Family Medicine

## 2024-07-06 DIAGNOSIS — M810 Age-related osteoporosis without current pathological fracture: Secondary | ICD-10-CM

## 2024-07-23 DIAGNOSIS — S86911A Strain of unspecified muscle(s) and tendon(s) at lower leg level, right leg, initial encounter: Secondary | ICD-10-CM | POA: Diagnosis not present

## 2024-07-29 ENCOUNTER — Other Ambulatory Visit: Payer: Self-pay | Admitting: Family Medicine

## 2024-07-29 ENCOUNTER — Telehealth: Payer: Medicare Other | Admitting: Adult Health

## 2024-07-29 DIAGNOSIS — E042 Nontoxic multinodular goiter: Secondary | ICD-10-CM

## 2024-08-05 ENCOUNTER — Other Ambulatory Visit

## 2024-08-06 ENCOUNTER — Ambulatory Visit: Admitting: Adult Health

## 2024-10-27 ENCOUNTER — Other Ambulatory Visit

## 2024-10-28 ENCOUNTER — Ambulatory Visit: Admitting: Cardiology

## 2024-11-05 ENCOUNTER — Other Ambulatory Visit

## 2024-12-02 ENCOUNTER — Other Ambulatory Visit

## 2024-12-04 ENCOUNTER — Ambulatory Visit: Admitting: Adult Health

## 2024-12-22 ENCOUNTER — Ambulatory Visit: Admitting: Cardiology

## 2025-01-29 ENCOUNTER — Other Ambulatory Visit (HOSPITAL_BASED_OUTPATIENT_CLINIC_OR_DEPARTMENT_OTHER)
